# Patient Record
Sex: Male | Born: 1935 | Race: White | Hispanic: No | Smoking: Former smoker
Health system: Southern US, Community
[De-identification: ages and names within clinical notes are randomized; demographics above are authoritative.]

## PROBLEM LIST (undated history)

## (undated) DIAGNOSIS — C801 Malignant (primary) neoplasm, unspecified: Secondary | ICD-10-CM

## (undated) DIAGNOSIS — H269 Unspecified cataract: Secondary | ICD-10-CM

## (undated) DIAGNOSIS — T7840XA Allergy, unspecified, initial encounter: Secondary | ICD-10-CM

## (undated) DIAGNOSIS — M1990 Unspecified osteoarthritis, unspecified site: Secondary | ICD-10-CM

## (undated) DIAGNOSIS — I639 Cerebral infarction, unspecified: Secondary | ICD-10-CM

## (undated) DIAGNOSIS — K219 Gastro-esophageal reflux disease without esophagitis: Secondary | ICD-10-CM

## (undated) DIAGNOSIS — E785 Hyperlipidemia, unspecified: Secondary | ICD-10-CM

## (undated) DIAGNOSIS — I10 Essential (primary) hypertension: Secondary | ICD-10-CM

## (undated) HISTORY — DX: Unspecified osteoarthritis, unspecified site: M19.90

## (undated) HISTORY — PX: PROSTATECTOMY: SHX69

## (undated) HISTORY — DX: Allergy, unspecified, initial encounter: T78.40XA

## (undated) HISTORY — DX: Gastro-esophageal reflux disease without esophagitis: K21.9

## (undated) HISTORY — DX: Cerebral infarction, unspecified: I63.9

## (undated) HISTORY — PX: EYE SURGERY: SHX253

## (undated) HISTORY — DX: Malignant (primary) neoplasm, unspecified: C80.1

## (undated) HISTORY — DX: Unspecified cataract: H26.9

## (undated) HISTORY — DX: Hyperlipidemia, unspecified: E78.5

---

## 1975-02-14 HISTORY — PX: HERNIA REPAIR: SHX51

## 2003-05-06 ENCOUNTER — Ambulatory Visit (HOSPITAL_COMMUNITY): Admission: RE | Admit: 2003-05-06 | Discharge: 2003-05-06 | Payer: Self-pay | Admitting: Urology

## 2003-06-23 ENCOUNTER — Encounter (INDEPENDENT_AMBULATORY_CARE_PROVIDER_SITE_OTHER): Payer: Self-pay | Admitting: *Deleted

## 2003-06-23 ENCOUNTER — Ambulatory Visit (HOSPITAL_COMMUNITY): Admission: RE | Admit: 2003-06-23 | Discharge: 2003-06-23 | Payer: Self-pay | Admitting: Gastroenterology

## 2003-07-14 ENCOUNTER — Encounter (INDEPENDENT_AMBULATORY_CARE_PROVIDER_SITE_OTHER): Payer: Self-pay | Admitting: *Deleted

## 2003-07-14 ENCOUNTER — Inpatient Hospital Stay (HOSPITAL_COMMUNITY): Admission: RE | Admit: 2003-07-14 | Discharge: 2003-07-18 | Payer: Self-pay | Admitting: Urology

## 2005-04-10 ENCOUNTER — Ambulatory Visit (HOSPITAL_COMMUNITY): Admission: RE | Admit: 2005-04-10 | Discharge: 2005-04-10 | Payer: Self-pay | Admitting: Urology

## 2005-04-18 ENCOUNTER — Ambulatory Visit: Admission: RE | Admit: 2005-04-18 | Discharge: 2005-07-02 | Payer: Self-pay | Admitting: Radiation Oncology

## 2007-04-11 ENCOUNTER — Emergency Department (HOSPITAL_COMMUNITY): Admission: EM | Admit: 2007-04-11 | Discharge: 2007-04-11 | Payer: Self-pay | Admitting: Emergency Medicine

## 2007-05-01 ENCOUNTER — Ambulatory Visit (HOSPITAL_BASED_OUTPATIENT_CLINIC_OR_DEPARTMENT_OTHER): Admission: RE | Admit: 2007-05-01 | Discharge: 2007-05-01 | Payer: Self-pay | Admitting: Urology

## 2007-11-27 ENCOUNTER — Ambulatory Visit (HOSPITAL_COMMUNITY): Admission: RE | Admit: 2007-11-27 | Discharge: 2007-11-27 | Payer: Self-pay | Admitting: Urology

## 2010-06-28 NOTE — Op Note (Signed)
NAME:  Lawrence Douglas, Lawrence Douglas               ACCOUNT NO.:  [id number]   MEDICAL RECORD NO.:  [id number]          PATIENT TYPE:  AMB   LOCATION:  NESC                         FACILITY:  [facility]   PHYSICIAN:  Boston Service, M.D.DATE OF BIRTH:  [birth year]   DATE OF PROCEDURE:  05/01/2007  DATE OF DISCHARGE:                               OPERATIVE REPORT   PREOPERATIVE DIAGNOSIS:  A 77 year old male  who had RRP PLND in May  2004.  Final path showed a positive right seminal vesical which prompted  follow-up radiation therapy, Maryln Gottron, MD.  The patient was  recently discovered to have progressive narrowing at the bladder neck  consistent with contracture.  A Foley catheter was placed after office  dilation.  The patient returns today for laser incision of the bladder  neck.   POSTOPERATIVE DIAGNOSIS:  Same.   PROCEDURE:  Cystoscopy, retrograde holmium laser incision of bladder  neck contracture.   SURGEON:  Humphries.   ASSISTANT:  None.   ANESTHESIA:  General.   FINDINGS:  Bladder neck contracture.   SPECIMENS:  None.   ESTIMATED BLOOD LOSS:  Minimal.   COMPLICATIONS:  None obvious.   DESCRIPTION OF PROCEDURE:  The patient was prepped and draped in the  dorsal lithotomy position after institution of adequate level of general  anesthesia.  A well-lubricated 21-French panendoscope was gently  inserted, normal urethra.  The patient had wide-mouth bladder neck  contracture, prostate surgically absent, orifices well away from the  trigone effluxing clear urine.  Bladder consistent with recent  indwelling Foley catheter but no other obvious intravesical pathology.   Blocking catheter selected.  Right and left retrogrades were performed.  Prominent deviation of the ureter over the vessels but otherwise no  anatomic deformity.  No filling defect or obstruction.  Prompt drainage  at 5-10 minutes.  The ureteral catheter was then used to position the  500 laser fiber.  Bladder  neck contracture was then carefully incised  over period of about 35-40 minutes, taking care to keep the incision  strictly at the 3 o'clock and 9 o'clock  position down through scar tissue to pink vascular tissue.  Once the  contracture had been well incised, the bladder was filled to capacity.  Cystoscope was removed, and a 20-French Foley was inserted and left to  straight drain.  The patient was returned to recovery after B & O  suppository.           ______________________________  Boston Service, M.D.     RH/MEDQ  D:  05/01/2007  T:  05/01/2007  Job:  [id number]   cc:   Durenda Hurt, M.D.  Fax: [phone]   [facility], M.D.  Fax: [phone]

## 2010-06-28 NOTE — Consult Note (Signed)
NAME:  Lawrence Douglas, WEYENBERG NO.:  [id number]   MEDICAL RECORD NO.:  [id number]          PATIENT TYPE:  EMS   LOCATION:  ED                           FACILITY:  [facility]   PHYSICIAN:  Maretta Bees. Vonita Moss, M.D.DATE OF BIRTH:  August 27, 1931   DATE OF CONSULTATION:  04/11/2007  DATE OF DISCHARGE:                                 CONSULTATION   I was asked to see this patient for Dr. Read Drivers for urinary retention.  This gentleman had previous TUR of the prostate and recently had  hematuria.  He was worked up with a CT scan by Dr. Wanda Plump, and  yesterday around noontime, a cystoscopy in the office was performed.  The patient said that it was difficult, but he had no bleeding.  He was  sent home on some antibiotics.  From 6 o'clock last night until 2  o'clock this morning when he presented to the emergency room, he could  not void and was in pain.  Dr. Read Drivers' and his P.A. tried to insert a  Foley catheter but could not do, and they asked me to come and see this  gentleman and correct the problem.  Just before I arrived to the  emergency room, the patient voided light brown-colored urine and voided  about 250 mL with what he said was a normal flow.  He is much relieved  of his discomfort.   PHYSICAL EXAMINATION:  GENERAL:  He is alert and oriented and in no  acute distress.  ABDOMEN:  Soft, nontender.  I cannot palpate a full bladder.  GU:  Penis, urethra, meatus, scrotum, testicles, and epididymides  unremarkable.   IMPRESSION:  Postoperative urinary retention, probably resolved.  I  discussed the options with the patient including having me try to put in  a catheter at this time versus seeing if he can continue to void well on  his own and then come to the office as needed.  He opted not to have  further catheterization.  It is now just about 4:00 a.m., and I told the  patient not to drink any more fluids between now and 8 a.m. unless he  voids and then come to our office  tomorrow if he cannot void or is not  voiding with a good stream.  Otherwise, he is due to see Dr. Wanda Plump  on April 19, 2007, and he will finish up his antibiotics.      Maretta Bees. Vonita Moss, M.D.  Electronically Signed     LJP/MEDQ  D:  04/11/2007  T:  04/11/2007  Job:  [id number]

## 2010-07-01 NOTE — Discharge Summary (Signed)
NAME:  Lawrence Douglas, Lawrence Douglas                      ACCOUNT NO.:  [id number]   MEDICAL RECORD NO.:  [id number]                   PATIENT TYPE:  INP   LOCATION:  0347                                 FACILITY:  [facility]   PHYSICIAN:  Boston Service, M.D.             DATE OF BIRTH:  08/30/1931   DATE OF ADMISSION:  07/14/2003  DATE OF DISCHARGE:  07/18/2003                                 DISCHARGE SUMMARY   INTERNIST:  Leanne Chang, M.D.   UROLOGIST:  Boston Service, M.D.   INDICATIONS:  Medications, allergies, tobacco, ETOH.   Past medical history, social history, physical exam, and review of systems  are all outlined in the admitting note.   HOSPITAL COURSE:  The patient underwent radical retropubic prostatectomy and  bilateral pelvic lymph node dissection on Jul 14, 2003. The patient had a  pleasantly uneventful postoperative recovery with the exception of elevated  Jackson-Pratt output on postoperative day one and postoperative day two. By  postoperative day two hemoglobin was stable at 9.8, Jackson-Pratt output the  first day had been 445 cc and by the second day it had dropped to 160 cc.  The patient was advanced to a regular diet, was ambulatory without  assistance. Decision was made to keep the patient in the hospital until  Jackson-Pratt drain could be removed. By postoperative day three, Al Pimple output had decreased to about 5-10 cc per shift and it was removed.  The patient was discharged home on July 18, 2003, with Foley catheter to  straight drain.   DISCHARGE MEDICATIONS:  Vicodin, Cipro, and Detrol.   Pathology report on tissue removed at the time of surgery revealed all lymph  nodes negative for metastatic carcinoma. 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 The patient was informed of  this finding.   FOLLOW-UP PLANS:  Will remove half of the  staples in one week. The patient  is instructed to call us if he has any questions or problems.                                               Boston Service, M.D.    RH/MEDQ  D:  07/28/2003  T:  07/28/2003  Job:  [id number]   cc:   Leanne Chang, M.D.  [address]  [place]  [place] [address]  Fax: [phone]

## 2010-07-01 NOTE — H&P (Signed)
NAME:  Lawrence Douglas, Lawrence Douglas                      ACCOUNT NO.:  [id number]   MEDICAL RECORD NO.:  [id number]                   PATIENT TYPE:  INP   LOCATION:  0005                                 FACILITY:  [facility]   PHYSICIAN:  Boston Service, M.D.             DATE OF BIRTH:  March 29, 1931   DATE OF ADMISSION:  07/14/2003  DATE OF DISCHARGE:                                HISTORY & PHYSICAL   References made to my office notes from March 2005, April 2005, and May of  2005.  A 77 year old male with high grade prostate cancer has had thorough  follow-up with Dr. Blossom Hoops recently noted to have prostatic induration  with PSA rise from 3.8 to 5.4 F divided by T was 10%.  Biopsy May 09, 2003, showed prostatic adenocarcinoma Gleason 4 + 4 60% of the tissue on the  right and 40% of the tissue on the left.  Bone scan at [facility] May 06, 2003, osteoarthritis, moderate effusion right knee, degenerative changes  left knee, no evidence of metastatic disease.  Review with the patient, the  risks and benefits as well as the therapeutic alternatives  including  watchful waiting, hormonal therapy, radiation therapy and surgery.  Reviewed  with him the risks and benefits of surgery as well as the possibility of  positive lymph nodes and positive margins given his high volume Gleason 8  disease.  This patient's strong clinical preference is to proceed with  surgery and appropriate __________ have been made.   MEDICATIONS:  Cardura, Accupril, hydrochlorothiazide.  Aspirin was  discontinued about two weeks ago.   ALLERGIES:  Denies.   PAST SURGICAL HISTORY:  1. Left inguinal hernia in the distant past.  2. Hand surgery in November of 2004.   SOCIAL HISTORY:  The patient wisely quit smoking in 1955.  Denies alcohol.  Caffeine intake:  Small amounts of coffee and soft drinks.   FAMILY HISTORY:  Positive mainly for heart disease, four brothers and six  sisters.  Wife is in poor health after a  CVA.  She has ongoing issues of  hypertension and diabetes.  The patient has two sons and three daughters,  but has come by himself on all office visits.   PHYSICAL EXAMINATION:  GENERAL APPEARANCE:  A 77 year old male appearing  somewhat older than his stated age.  HEENT:  Negative adenopathy, negative bruit.  LUNGS:  Clear to percussion and auscultation.  CARDIOVASCULAR:  Regular rate and rhythm without murmur or gallop.  ABDOMEN:  Soft, scaphoid.  Positive bowel sounds.  Well-healed LIH incision.  GU:  Prostatic induration, right more than left.  Bilateral descended  atrophic testes.  Penis without lesions.  Easily  retractable foreskin.  NEUROLOGIC:  Grossly intact.   PLAN:  Will proceed later this a.m. with retropubic prostatectomy and pelvic  lymph node dissection.  Boston Service, M.D.    RH/MEDQ  D:  07/14/2003  T:  07/14/2003  Job:  [id number]   cc:   Albin Felling, M.D.

## 2010-07-01 NOTE — Op Note (Signed)
NAME:  Lawrence Douglas, Lawrence Douglas                      ACCOUNT NO.:  [id number]   MEDICAL RECORD NO.:  [id number]                   PATIENT TYPE:  AMB   LOCATION:  ENDO                                 FACILITY:  [facility]   PHYSICIAN:  Petra Kuba, M.D.                 DATE OF BIRTH:  1931/05/25   DATE OF PROCEDURE:  06/23/2003  DATE OF DISCHARGE:                                 OPERATIVE REPORT   PROCEDURE:  Colonoscopy with biopsy.   INDICATIONS:  Guaiac positivity.  The patient overdue for colonic screening.  Consent was signed after risks, benefits, methods, and options were  thoroughly discussed in the office.   PREMEDICATIONS:  Demerol 50 mg, Versed 5 mg.   DESCRIPTION OF PROCEDURE:  Rectal inspection pertinent for external  hemorrhoids, small.  Digital exam was negative.  Video pediatric adjustable  colonoscope was inserted and easily advanced around the colon to the cecum.  This did require some abdominal pressure but no position changes.  On  insertion a few tiny transverse and right-sided polyps were seen.  Also some  scattered early diverticula were seen but no signs of bleeding.  Unfortunately, the prep on insertion was only fair.  The cecum was  identified by the appendiceal orifice and the ileocecal valve.  The scope  was inserted a short ways into the terminal ileum which was normal until  blood was seen coming from above.  The scope was slowly withdrawn.  Prep was  fair.  After over a liter of washing and suctioning, we were able to wash  and suction most of the stool or at least move it down and away from the  folds we were looking at.  On slow withdrawal through the colon, there were  probably eight ascending and transverse tiny, small polyps which were all  cold biopsied times one or two in the first container.  Scattered rare,  early diverticula were confirmed.  In the more proximal descending, a small  polyp was seen and a few cold biopsies were obtained and this was put  in the  second container.  We were at 45 cm on withdrawal.  The scope was then  further withdrawn.  No additional polyps were seen but the rare diverticula.  We slowly withdrew back to the rectum.  Anal rectal pull-through and  retroflexion confirms some small hemorrhoids.  The scope was reinserted a  short ways up the left side of the colon.  Air and water were suctioned.  The scope was removed.  The patient tolerated the procedure well.  There was  no obvious immediate complications.   ENDOSCOPIC DIAGNOSES:  1. Internal and external hemorrhoids, small.  2. Occasional small random diverticula.  3. Multiple right-sided tiny to small polyps, cold biopsied.  Elected not to     use cautery based on the prep.  4. Small descending polyp at 45 cm, cold biopsied.  5. Otherwise within  normal limits to the terminal ileum without any bleeding     seen.   PLAN:  Await CBC and will need to recheck guaiac in the future to determine  further work-up and plans.  Await pathology to determine future colonic  screening.                                               Petra Kuba, M.D.    MEM/MEDQ  D:  06/23/2003  T:  06/23/2003  Job:  [id number]   cc:   Leanne Chang, M.D.  [address]  [place]  [place] [address]  Fax: [phone]   Boston Service, M.D.  [address]  [place]  [place] [address]  Fax: [phone]

## 2010-07-01 NOTE — Op Note (Signed)
NAME:  Lawrence Douglas, Lawrence Douglas                      ACCOUNT NO.:  [id number]   MEDICAL RECORD NO.:  [id number]                   PATIENT TYPE:  INP   LOCATION:  0005                                 [facility]ACILITY:  [facility]   PHYSICIAN:  Boston Service, M.D.             DATE OF BIRTH:  11-08-1931   DATE OF PROCEDURE:  07/14/2003  DATE OF DISCHARGE:                                 OPERATIVE REPORT   LMD:  Neomia Dear, M.D.   UROLOGIST:  Boston Service, M.D.   FIRST ASSISTANT:  Bailey Mech, M.D.  Ronald L. Earlene Plater, M.D.   PREOPERATIVE DIAGNOSIS:  High-grade bilateral prostate cancer.   POSTOPERATIVE DIAGNOSIS:  High-grade bilateral prostate cancer.   PROCEDURES:  1. Radical retropubic prostatectomy.  2. Bilateral pelvic lymph node dissection.   SPECIMENS:  Right pelvic lymph nodes and left pelvic lymph nodes.  Vas and  attached seminal vesicles.   DRAINS:  20-French Foley catheter.  10-French Jackson-Pratt.   ANESTHESIA:  General.   ESTIMATED BLOOD LOSS:  700 cc.   DESCRIPTION OF PROCEDURE:  The patient was prepped and draped in the supine  position, after institution of an adequate level of general anesthesia.  Midline infraumbilical incision was carried through the skin and muscular  layers of the anterior abdominal wall.  Gentle retraction on the peritoneal  cavity revealed the retropubic space, as well as the right and left  obturator fossa.  Bookwalter retractor was positioned, node dissection was  then carried out between the external iliac veins superiorly and the  obturator nerve inferiorly.  Surprisingly, given the patient's high Glisson  grade, no enlarged or indurated nodes were identified.  All nodes were sent  for permanent section.  Endopelvic fascia was then taken down sharply lateral to the prostate, and  extended along the line of the neurovascular bundles.  Puboprostatic  ligaments were quite broad on each side, and in the course of taking down  the right  puboprostatic ligaments, bleeding was encountered along the right  lateral edge of the prostate.  Figure-of-eight sutures of 2-0 chromic  controlled bleeding.  McDougal clamp was then passed around the urethra  anteriorly.  An 0 Vicryl free tie was then used to ligate the dorsal vein  complex.  Back bleeding stitch of 0 chromic was placed along the anterior  prostate, with right-angled clamp distal to the prostatic apex anterior to  the urethra.  Dorsal vein complex was then divided. The urethra was easily  identified and circled with the fine Metzenbaum  scissors.  McDougal forceps  were then passed posterior to the urethra.  The urethra was elevated;  umbilical tape was passed posterior to the urethra, and a stump of urethra  was then divided using the knife on the long handle.  Indwelling catheter  was then retracted superiorly.  The patient had, what appeared to be,  adhesions within the right and left prostatic pedicle; this required careful  dissection on both  right and left sides, with fine right-angled forceps and  right-angled clip appliers.  The area of induration extended along the  prostatic base to the junction of the seminal vesicles and prostate --  suspicious for local tumor extension in this area, given pre-op biopsy of  Glisson 8 (60% on one side and 40% on the other side).  Careful attempt was  made to dissect out the vas and seminal vesicles.  During this dissection, a  small hole was made in the bladder, closed with two stitches of 2-0 chromic.  Once vas and seminal vesicles were freed up on both the right and left  sides, Vanderbilt forceps were used to spread apart muscular fibers at the  bladder neck.  The prostatic base was carefully separated from the bladder  neck.  Indwelling Foley catheter was then retracted superiorly.  Anterior  surface of the vas and seminal vesicles were then dissected free.  The  prostate with attached vas and seminal vesicles was then sent  as a specimen.   Careful inspection of the bladder showed a single chromic stitch along the  posterior trigone.  Right and left ureteral orifices were identified, after  infusion of indigo carmine.  Then 6-French ureteral catheter passed easily  at both the right and left orifice, without any resistance at about 15-20 cm  -- with immediate efflux of blue urine through the ureteral catheters.  Then  Grunwald sound was then passed per urethra.  Stitches were placed in the  urethra at the 12 o'clock, 10 o'clock, 8 o'clock, 4 o'clock and 2 o'clock  position.  Then 20-French silicone catheter, 20 cc in a 5 cc balloon was  then brought through the urethra and into the bladder.  Urethral stitches  were then brought through the bladder neck in a similar position; 2-0 Vicryl  on a UR6 needle with gentle traction on the catheter.  Urethral anastomosis  was then tied down.  A 10 flat Jackson-Pratt drain was brought out through a  stab incision in the left lower quadrant.   Fascia of the anterior abdominal wall was closed with #1 PDS in a running  stitch.  Skin was closed with skin staples.  Foley catheter was left to  straight drain.  Jackson-Pratt was left to bulb suction.  The patient was  then returned to recovery room in satisfactory condition.   ESTIMATED BLOOD LOSS:  700 cc.                                               Boston Service, M.D.    RH/MEDQ  D:  07/14/2003  T:  07/14/2003  Job:  [id number]   cc:   Windy Fast L. Ovidio Hanger, M.D.  [address]  [place]  [place] [address]  Fax: [phone]

## 2010-11-04 LAB — URINE MICROSCOPIC-ADD ON

## 2010-11-04 LAB — URINALYSIS, ROUTINE W REFLEX MICROSCOPIC
Bilirubin Urine: NEGATIVE
Leukocytes, UA: NEGATIVE
Specific Gravity, Urine: 1.019

## 2010-11-07 ENCOUNTER — Other Ambulatory Visit (HOSPITAL_COMMUNITY): Payer: Self-pay | Admitting: Urology

## 2010-11-07 DIAGNOSIS — C61 Malignant neoplasm of prostate: Secondary | ICD-10-CM

## 2010-11-07 LAB — POCT I-STAT 4, (NA,K, GLUC, HGB,HCT)
Glucose, Bld: 117 — ABNORMAL HIGH
HCT: 52
Hemoglobin: 17.7 — ABNORMAL HIGH
Operator id: 268271
Potassium: 4.1
Sodium: 139

## 2010-11-16 ENCOUNTER — Encounter (HOSPITAL_COMMUNITY)
Admission: RE | Admit: 2010-11-16 | Discharge: 2010-11-16 | Disposition: A | Discharge status: Home / Self Care | Payer: Medicare Other | Source: Ambulatory Visit | Attending: Urology | Admitting: Urology

## 2010-11-16 DIAGNOSIS — M1710 Unilateral primary osteoarthritis, unspecified knee: Secondary | ICD-10-CM | POA: Insufficient documentation

## 2010-11-16 DIAGNOSIS — R972 Elevated prostate specific antigen [PSA]: Secondary | ICD-10-CM | POA: Insufficient documentation

## 2010-11-16 DIAGNOSIS — C61 Malignant neoplasm of prostate: Secondary | ICD-10-CM | POA: Insufficient documentation

## 2010-11-16 DIAGNOSIS — M25569 Pain in unspecified knee: Secondary | ICD-10-CM | POA: Insufficient documentation

## 2010-11-16 MED ORDER — TECHNETIUM TC 99M MEDRONATE IV KIT
24.0000 | PACK | Freq: Once | INTRAVENOUS | Status: AC | PRN
  Administered 2010-11-16: 24 via INTRAVENOUS

## 2014-11-13 ENCOUNTER — Encounter (HOSPITAL_COMMUNITY): Payer: Self-pay | Admitting: Emergency Medicine

## 2014-11-13 ENCOUNTER — Emergency Department (HOSPITAL_COMMUNITY): Payer: Medicare Other

## 2014-11-13 ENCOUNTER — Emergency Department (HOSPITAL_COMMUNITY)
Admission: EM | Admit: 2014-11-13 | Discharge: 2014-11-13 | Disposition: A | Discharge status: Home / Self Care | Payer: Medicare Other | Attending: Emergency Medicine | Admitting: Emergency Medicine

## 2014-11-13 DIAGNOSIS — Y998 Other external cause status: Secondary | ICD-10-CM | POA: Insufficient documentation

## 2014-11-13 DIAGNOSIS — Y9389 Activity, other specified: Secondary | ICD-10-CM | POA: Diagnosis not present

## 2014-11-13 DIAGNOSIS — M79672 Pain in left foot: Secondary | ICD-10-CM

## 2014-11-13 DIAGNOSIS — I10 Essential (primary) hypertension: Secondary | ICD-10-CM | POA: Insufficient documentation

## 2014-11-13 DIAGNOSIS — S99922A Unspecified injury of left foot, initial encounter: Secondary | ICD-10-CM | POA: Insufficient documentation

## 2014-11-13 DIAGNOSIS — W228XXA Striking against or struck by other objects, initial encounter: Secondary | ICD-10-CM | POA: Insufficient documentation

## 2014-11-13 DIAGNOSIS — Y9289 Other specified places as the place of occurrence of the external cause: Secondary | ICD-10-CM | POA: Insufficient documentation

## 2014-11-13 DIAGNOSIS — S99912A Unspecified injury of left ankle, initial encounter: Secondary | ICD-10-CM | POA: Diagnosis not present

## 2014-11-13 DIAGNOSIS — M25572 Pain in left ankle and joints of left foot: Secondary | ICD-10-CM

## 2014-11-13 HISTORY — DX: Essential (primary) hypertension: I10

## 2014-11-13 NOTE — ED Notes (Signed)
Pt returned from xray

## 2014-11-13 NOTE — ED Provider Notes (Signed)
CSN: [id number]     Arrival date & time 11/13/14  6295 History   First MD Initiated Contact with Patient 11/13/14 [phone]     Chief Complaint  Patient presents with  . Ankle Pain     (Consider location/radiation/quality/duration/timing/severity/associated sxs/prior Treatment) Patient is a 81 y.o. male presenting with ankle pain. The history is provided by the patient and the spouse.  Ankle Pain Associated symptoms: no back pain and no fever    patient with complaint of left ankle pain after hitting it on the door jam 2 days ago. Swelling noted patient concerned about a fracture. Patient has been able to walk on it but has discomfort in the lateral part of the ankle and on the heel. No other complaints.  Past Medical History  Diagnosis Date  . Hypertension    Past Surgical History  Procedure Laterality Date  . Hernia repair  1977   No family history on file. Social History  Substance Use Topics  . Smoking status: Never Smoker   . Smokeless tobacco: None  . Alcohol Use: No    Review of Systems  Constitutional: Negative for fever.  HENT: Negative for congestion.   Eyes: Negative for redness.  Respiratory: Negative for shortness of breath.   Cardiovascular: Negative for chest pain.  Gastrointestinal: Negative for abdominal pain.  Genitourinary: Negative for dysuria.  Musculoskeletal: Positive for joint swelling. Negative for back pain.  Skin: Negative for rash and wound.  Neurological: Negative for headaches.  Hematological: Does not bruise/bleed easily.  Psychiatric/Behavioral: Negative for confusion.      Allergies  Review of patient's allergies indicates no known allergies.  Home Medications   Prior to Admission medications   Not on File   BP 96/79 mmHg  Pulse 84  Temp(Src) 98.6 F (37 C) (Oral)  Resp 18  Ht 5\' 6"  (1.676 m)  Wt 190 lb (86.183 kg)  BMI 30.68 kg/m2  SpO2 97% Physical Exam  Constitutional: He is oriented to person, place, and time. He  appears well-developed and well-nourished. No distress.  HENT:  Head: Normocephalic and atraumatic.  Mouth/Throat: Oropharynx is clear and moist.  Eyes: Conjunctivae and EOM are normal. Pupils are equal, round, and reactive to light.  Cardiovascular: Normal rate, regular rhythm and normal heart sounds.   Pulmonary/Chest: Effort normal and breath sounds normal. No respiratory distress.  Abdominal: Soft. Bowel sounds are normal. There is no tenderness.  Musculoskeletal: He exhibits edema and tenderness.  Patient with lateral swelling to the left ankle with some tenderness to palpation as well as some tenderness over the heel. Patient has a bit of deformity to both ankles. Dorsalis pedis pulses 1+ Refill is 2 seconds. Patient with good range of motion at the ankle and toes no proximal fibula tenderness on the left side.  Neurological: He is alert and oriented to person, place, and time. No cranial nerve deficit. He exhibits normal muscle tone. Coordination normal.  Skin: Skin is warm. No rash noted. No erythema.  Nursing note and vitals reviewed.   ED Course  Procedures (including critical care time) Labs Review Labs Reviewed - No data to display  Imaging Review Dg Ankle Complete Left  11/13/2014   CLINICAL DATA:  Slipped and twisted LEFT ankle today and bathroom, lateral LEFT ankle pain and swelling radiating to dorsal aspect of foot, limited range of motion  EXAM: LEFT ANKLE COMPLETE - 3+ VIEW  COMPARISON:  None  FINDINGS: Diffuse osseous demineralization.  Soft tissue swelling greatest laterally.  Joint spaces  preserved.  No acute fracture, dislocation, or bone destruction.  Plantar calcaneal spur.  IMPRESSION: Osseous demineralization with calcaneal spurring.  No acute osseous abnormalities.   Electronically Signed   By: Lavonia Dana M.D.   On: 11/13/2014 10:28   Dg Foot Complete Left  11/13/2014   CLINICAL DATA:  Twisted foot in bathroom. Foot pain and swelling. Initial encounter.  EXAM:  LEFT FOOT - COMPLETE 3+ VIEW  COMPARISON:  05/06/2003  FINDINGS: There is no evidence of fracture or dislocation. Diffuse osteopenia noted. Osteoarthritis seen involving the first tarsal- metatarsal joint. Plantar calcaneal bone spur also noted.  IMPRESSION: No acute findings.   Electronically Signed   By: Earle Gell M.D.   On: 11/13/2014 10:39   I have personally reviewed and evaluated these images and lab results as part of my medical decision-making.   EKG Interpretation None      MDM   Final diagnoses:  Ankle pain, left  Foot pain, left   X-rays of the left foot and left ankle without any bony injury. No tenderness to the proximal fibula. Cap refill to the toes is 2 seconds. Palpable dorsalis pedis pulse 1+.  Patient will be referred to podiatry for additional follow-up for the foot pain and ankle pain. Patient has pain medicine at home.    Fredia Sorrow, MD 11/13/14 1125

## 2014-11-13 NOTE — Discharge Instructions (Signed)
X-rays of the left foot and ankle without any bony injury. However you do have a lot of arthritis in the area and a heel spur. Recommend follow-up with podiatry.

## 2014-11-13 NOTE — ED Notes (Signed)
C/o left ankle pain after patient hit a door jam with it 2 days ago.  Swelling noted but no obvious deformity.  Able to bear weight but with pain.  CMS intact.  VSS

## 2014-11-19 ENCOUNTER — Ambulatory Visit (INDEPENDENT_AMBULATORY_CARE_PROVIDER_SITE_OTHER): Payer: Medicare Other | Admitting: Sports Medicine

## 2014-11-19 ENCOUNTER — Encounter: Payer: Self-pay | Admitting: Sports Medicine

## 2014-11-19 DIAGNOSIS — M25572 Pain in left ankle and joints of left foot: Secondary | ICD-10-CM

## 2014-11-19 DIAGNOSIS — S93402A Sprain of unspecified ligament of left ankle, initial encounter: Secondary | ICD-10-CM | POA: Diagnosis not present

## 2014-11-19 DIAGNOSIS — M25472 Effusion, left ankle: Secondary | ICD-10-CM | POA: Diagnosis not present

## 2014-11-19 DIAGNOSIS — M79609 Pain in unspecified limb: Secondary | ICD-10-CM

## 2014-11-19 DIAGNOSIS — B351 Tinea unguium: Secondary | ICD-10-CM | POA: Diagnosis not present

## 2014-11-19 NOTE — Progress Notes (Signed)
Subjective:    Patient ID: Lawrence Douglas, male    DOB: 03-15-1931, 81 y.o.   MRN: [id number]  HPI: [place], 81 year old male patient presents to office today stating that he went to ER after tripping, falling, and twisting left ankle. Patient is assisted by daughter. Patient states that he had an x-ray done and the ER doc saw a bone spur so wanted to have it looked at to see if anything can be done about it. Patient states that initially he thought he broke his left ankle because it hurts directly over the fibula. Patient states that this happened about 1 week ago since the pain in the ankle has gotten better.  Patient also desires nails to be trimmed, unable to trim himself. Patient denies any related constitutional symptoms or any other pedal complaints at this time.   There are no active problems to display for this patient.  No current outpatient prescriptions on file prior to visit.   No current facility-administered medications on file prior to visit.   No Known Allergies  Review of Systems  All other systems reviewed and are negative.      Objective:   Physical Exam Objective:  General: Well developed, nourished, in no acute distress, alert and oriented x3  Dermatological: Skin is warm, dry and supple bilateral. Nails x 10 are tender, thick, discolored with subungal debris resembling onychomycosis. There are no open sores, no preulcerative lesions, no rash or signs of infection present.  Vascular: Dorsalis Pedis artery and Posterior Tibial artery pedal pulses are 2/4 bilateral with immedate capillary fill time. Diminished Pedal hair growth present. Mild varicosities bilateral. Focal pitting 1+edema to left ankle.   Neruologic: Grossly intact via light touch bilateral. Vibratory intact via tuning fork bilateral. Protective threshold with Semmes Wienstein monofilament intact to all pedal sites bilateral. No Babinski or clonus noted bilateral.   Musculoskeletal:  Mild tenderness with palpation to left fibula; No pain to site with tuning fork. No ankle instability, no pain with palpation to medial and lateral left ankle ligaments,  No pain, crepitus, or limitation noted with foot and ankle range of motion bilateral. Muscular strength 5/5 in all groups tested bilateral.  Gait: Cane assisted, Nonantalgic.   X-rays, Left foot and ankle (11/13/14) from [facility] reviewed: Significant osteopenia, pes planus, enthesopathy, mild midfoot arthritis. No fracture/dislocation. Ankle mortise preserved.      Assessment & Plan:   Problem List Items Addressed This Visit    None    Visit Diagnoses    Ankle pain, left    -  Primary    Sprain of ankle, left, initial encounter        Left ankle swelling        Pain due to onychomycosis of nail          -Complete examination performed -X[facility] from [facility] reviewed -Discussed with patient plantar heel spur and how can be incidental finding; does not correlate to current ankle pain and when asymptomatic no treatment is done for it -Rx elastic anklet for left ankle swelling and to give ankle support; educated on use and advised patient that likely contusion present over the fibula from trip/fall injury and that this well take time to resolve. Recommend local ice 1-2x daily as needed to area. Recommend supportive shoes and to walk at all times with cane to prevent falls. -Mechanically debrided mycotic nails x 10 using sterile nail nipper and reduced thickness using dremel without incident. -Patient to return  to office as needed or sooner if problems arise.  Landis Martins, DPM

## 2014-11-20 ENCOUNTER — Encounter: Payer: Self-pay | Admitting: Sports Medicine

## 2016-06-26 DIAGNOSIS — E785 Hyperlipidemia, unspecified: Secondary | ICD-10-CM | POA: Diagnosis not present

## 2016-06-26 DIAGNOSIS — I10 Essential (primary) hypertension: Secondary | ICD-10-CM | POA: Diagnosis not present

## 2016-06-30 DIAGNOSIS — C61 Malignant neoplasm of prostate: Secondary | ICD-10-CM | POA: Diagnosis not present

## 2016-06-30 DIAGNOSIS — M8580 Other specified disorders of bone density and structure, unspecified site: Secondary | ICD-10-CM | POA: Diagnosis not present

## 2016-08-31 DIAGNOSIS — C61 Malignant neoplasm of prostate: Secondary | ICD-10-CM | POA: Diagnosis not present

## 2016-08-31 DIAGNOSIS — N320 Bladder-neck obstruction: Secondary | ICD-10-CM | POA: Diagnosis not present

## 2016-09-25 DIAGNOSIS — E785 Hyperlipidemia, unspecified: Secondary | ICD-10-CM | POA: Diagnosis not present

## 2016-09-25 DIAGNOSIS — I10 Essential (primary) hypertension: Secondary | ICD-10-CM | POA: Diagnosis not present

## 2016-09-25 DIAGNOSIS — M810 Age-related osteoporosis without current pathological fracture: Secondary | ICD-10-CM | POA: Diagnosis not present

## 2016-09-25 DIAGNOSIS — C61 Malignant neoplasm of prostate: Secondary | ICD-10-CM | POA: Diagnosis not present

## 2016-09-27 ENCOUNTER — Emergency Department (HOSPITAL_COMMUNITY): Payer: Medicare Other

## 2016-09-27 ENCOUNTER — Encounter (HOSPITAL_COMMUNITY): Payer: Self-pay

## 2016-09-27 ENCOUNTER — Inpatient Hospital Stay (HOSPITAL_COMMUNITY)
Admission: EM | Admit: 2016-09-27 | Discharge: 2016-09-30 | DRG: 872 | Disposition: A | Discharge status: Skilled Nursing Facility | Payer: Medicare Other | Attending: Internal Medicine | Admitting: Internal Medicine

## 2016-09-27 DIAGNOSIS — M1711 Unilateral primary osteoarthritis, right knee: Secondary | ICD-10-CM | POA: Diagnosis not present

## 2016-09-27 DIAGNOSIS — A419 Sepsis, unspecified organism: Secondary | ICD-10-CM | POA: Diagnosis not present

## 2016-09-27 DIAGNOSIS — K5900 Constipation, unspecified: Secondary | ICD-10-CM | POA: Diagnosis present

## 2016-09-27 DIAGNOSIS — M79651 Pain in right thigh: Secondary | ICD-10-CM | POA: Diagnosis not present

## 2016-09-27 DIAGNOSIS — E785 Hyperlipidemia, unspecified: Secondary | ICD-10-CM | POA: Diagnosis not present

## 2016-09-27 DIAGNOSIS — E876 Hypokalemia: Secondary | ICD-10-CM | POA: Diagnosis not present

## 2016-09-27 DIAGNOSIS — R319 Hematuria, unspecified: Secondary | ICD-10-CM | POA: Diagnosis not present

## 2016-09-27 DIAGNOSIS — Z7982 Long term (current) use of aspirin: Secondary | ICD-10-CM | POA: Diagnosis not present

## 2016-09-27 DIAGNOSIS — C61 Malignant neoplasm of prostate: Secondary | ICD-10-CM | POA: Diagnosis present

## 2016-09-27 DIAGNOSIS — S199XXA Unspecified injury of neck, initial encounter: Secondary | ICD-10-CM | POA: Diagnosis not present

## 2016-09-27 DIAGNOSIS — R7989 Other specified abnormal findings of blood chemistry: Secondary | ICD-10-CM | POA: Diagnosis not present

## 2016-09-27 DIAGNOSIS — J0130 Acute sphenoidal sinusitis, unspecified: Secondary | ICD-10-CM | POA: Diagnosis not present

## 2016-09-27 DIAGNOSIS — W19XXXA Unspecified fall, initial encounter: Secondary | ICD-10-CM

## 2016-09-27 DIAGNOSIS — Z6829 Body mass index (BMI) 29.0-29.9, adult: Secondary | ICD-10-CM

## 2016-09-27 DIAGNOSIS — R296 Repeated falls: Secondary | ICD-10-CM | POA: Diagnosis present

## 2016-09-27 DIAGNOSIS — S0990XA Unspecified injury of head, initial encounter: Secondary | ICD-10-CM | POA: Diagnosis not present

## 2016-09-27 DIAGNOSIS — E440 Moderate protein-calorie malnutrition: Secondary | ICD-10-CM | POA: Diagnosis present

## 2016-09-27 DIAGNOSIS — J329 Chronic sinusitis, unspecified: Secondary | ICD-10-CM | POA: Diagnosis present

## 2016-09-27 DIAGNOSIS — R451 Restlessness and agitation: Secondary | ICD-10-CM | POA: Diagnosis not present

## 2016-09-27 DIAGNOSIS — R6510 Systemic inflammatory response syndrome (SIRS) of non-infectious origin without acute organ dysfunction: Secondary | ICD-10-CM | POA: Diagnosis present

## 2016-09-27 DIAGNOSIS — R140 Abdominal distension (gaseous): Secondary | ICD-10-CM | POA: Diagnosis not present

## 2016-09-27 DIAGNOSIS — M79622 Pain in left upper arm: Secondary | ICD-10-CM | POA: Diagnosis not present

## 2016-09-27 DIAGNOSIS — S4992XA Unspecified injury of left shoulder and upper arm, initial encounter: Secondary | ICD-10-CM | POA: Diagnosis not present

## 2016-09-27 DIAGNOSIS — M25551 Pain in right hip: Secondary | ICD-10-CM | POA: Diagnosis not present

## 2016-09-27 DIAGNOSIS — Z79899 Other long term (current) drug therapy: Secondary | ICD-10-CM

## 2016-09-27 DIAGNOSIS — E860 Dehydration: Secondary | ICD-10-CM | POA: Diagnosis not present

## 2016-09-27 DIAGNOSIS — M25561 Pain in right knee: Secondary | ICD-10-CM | POA: Diagnosis not present

## 2016-09-27 DIAGNOSIS — I10 Essential (primary) hypertension: Secondary | ICD-10-CM | POA: Diagnosis present

## 2016-09-27 DIAGNOSIS — S79911A Unspecified injury of right hip, initial encounter: Secondary | ICD-10-CM | POA: Diagnosis not present

## 2016-09-27 DIAGNOSIS — S299XXA Unspecified injury of thorax, initial encounter: Secondary | ICD-10-CM | POA: Diagnosis not present

## 2016-09-27 LAB — CBC WITH DIFFERENTIAL/PLATELET
BASOS ABS: 0 10*3/uL (ref 0.0–0.1)
BASOS PCT: 0 %
Eosinophils Absolute: 0 10*3/uL (ref 0.0–0.7)
Eosinophils Relative: 0 %
HEMATOCRIT: 36.6 % — AB (ref 39.0–52.0)
HEMOGLOBIN: 12.2 g/dL — AB (ref 13.0–17.0)
LYMPHS PCT: 12 %
Lymphs Abs: 1.6 10*3/uL (ref 0.7–4.0)
MCH: 29.3 pg (ref 26.0–34.0)
MCHC: 33.3 g/dL (ref 30.0–36.0)
MCV: 88 fL (ref 78.0–100.0)
Monocytes Absolute: 1.5 10*3/uL — ABNORMAL HIGH (ref 0.1–1.0)
Monocytes Relative: 11 %
NEUTROS ABS: 10.4 10*3/uL — AB (ref 1.7–7.7)
NEUTROS PCT: 77 %
Platelets: 285 10*3/uL (ref 150–400)
RBC: 4.16 MIL/uL — AB (ref 4.22–5.81)
RDW: 14.7 % (ref 11.5–15.5)
WBC: 13.5 10*3/uL — AB (ref 4.0–10.5)

## 2016-09-27 LAB — I-STAT CG4 LACTIC ACID, ED: Lactic Acid, Venous: 2.39 mmol/L (ref 0.5–1.9)

## 2016-09-27 LAB — COMPREHENSIVE METABOLIC PANEL
ALBUMIN: 3.7 g/dL (ref 3.5–5.0)
ALT: 10 U/L — AB (ref 17–63)
ANION GAP: 8 (ref 5–15)
AST: 22 U/L (ref 15–41)
Alkaline Phosphatase: 52 U/L (ref 38–126)
BUN: 20 mg/dL (ref 6–20)
CHLORIDE: 110 mmol/L (ref 101–111)
CO2: 23 mmol/L (ref 22–32)
CREATININE: 0.9 mg/dL (ref 0.61–1.24)
Calcium: 9.3 mg/dL (ref 8.9–10.3)
GFR calc non Af Amer: >60 mL/min (ref >60)
GLUCOSE: 163 mg/dL — AB (ref 65–99)
Potassium: 4 mmol/L (ref 3.5–5.1)
SODIUM: 141 mmol/L (ref 135–145)
Total Bilirubin: 0.5 mg/dL (ref 0.3–1.2)
Total Protein: 6.8 g/dL (ref 6.5–8.1)

## 2016-09-27 LAB — URINALYSIS, ROUTINE W REFLEX MICROSCOPIC
BILIRUBIN URINE: NEGATIVE
Bacteria, UA: NONE SEEN
GLUCOSE, UA: NEGATIVE mg/dL
KETONES UR: 5 mg/dL — AB
LEUKOCYTES UA: NEGATIVE
Nitrite: NEGATIVE
PH: 5 (ref 5.0–8.0)
Protein, ur: 30 mg/dL — AB
Specific Gravity, Urine: 1.024 (ref 1.005–1.030)

## 2016-09-27 MED ORDER — SODIUM CHLORIDE 0.9 % IV SOLN
3.0000 g | Freq: Four times a day (QID) | INTRAVENOUS | Status: DC
  Filled 2016-09-27: qty 3

## 2016-09-27 MED ORDER — SODIUM CHLORIDE 0.9 % IV BOLUS (SEPSIS)
1000.0000 mL | Freq: Once | INTRAVENOUS | Status: AC
  Administered 2016-09-27: 1000 mL via INTRAVENOUS

## 2016-09-27 NOTE — ED Triage Notes (Signed)
Lawrence Douglas yesterday states he tripped and fell now left upper arm pain worse with movement and right knee pain able to bear weight with his walker since fall yesterday. No LOC voiced no other pain voiced.

## 2016-09-27 NOTE — ED Provider Notes (Addendum)
[facility] DEPT Provider Note   CSN: [id number] Arrival date & time: 09/27/16  1954     History   Chief Complaint Chief Complaint  Patient presents with  . Fall    HPI Lawrence Douglas is a 83 y.o. male.  HPI    83 yo M with h/o HTN, severw knee OA here with fatigue and pain. Pt reports that over the past 3 weeks, he has fallen multiple times due to his legs "giving out." Last night, the pt reportedly was holding a wall fixture when it "gave out," causing him to fall. He does admit he has been more tired/fatigued lately and that this is contributing. His only sx includee nasal congestion, sinus pressure, and drainage in his mouth that has decreased his appetite. Otherwise, he states he had no significant pain prior to the fall. Since the fall. He has had severe right knee pain, right hip pain. He has been unable to ambulate due to this pain and has been lyign in bed. He feels fatigued and mildly weak. Otherwise, denies complaints. Of note, his knee does not hurt when sitting still. He has had no redness/wounds to the knee.  Past Medical History:  Diagnosis Date  . Hypertension     Patient Active Problem List   Diagnosis Date Noted  . Dehydration 09/28/2016  . Essential hypertension 09/28/2016  . Elevated lactic acid level 09/28/2016  . SIRS (systemic inflammatory response syndrome) ([place]) 09/28/2016  . Sinusitis 09/28/2016  . Prostate cancer ([place]) 09/28/2016  . Hematuria 09/28/2016    Past Surgical History:  Procedure Laterality Date  . HERNIA REPAIR  1977       Home Medications    Prior to Admission medications   Medication Sig Start Date End Date Taking? Authorizing Provider  amLODipine (NORVASC) 10 MG tablet Take 10 mg by mouth daily. 10/31/14  Yes [provider]  bicalutamide (CASODEX) 50 MG tablet TAKE 1 TAB DAILY FOR PROSTATE CANCER 10/14/14  Yes [provider]  CALCIUM CARBONATE PO Take 2 tablets by mouth daily.    Yes [provider]  enalapril (VASOTEC) 20 MG tablet Take 20 mg by mouth daily.  10/31/14  Yes [provider]  Omega-3 Fatty Acids (FISH OIL) 600 MG CAPS Take 2 capsules by mouth daily.   Yes [provider]  simvastatin (ZOCOR) 40 MG tablet Take 40 mg by mouth at bedtime. 10/31/14  Yes [provider]    Family History Family History  Problem Relation Age of Onset  . Stroke Father   . Leukemia Brother   . Stroke Brother   . Diabetes Daughter     Social History Social History  Substance Use Topics  . Smoking status: Former Research scientist (life sciences)  . Smokeless tobacco: Never Used  . Alcohol use No     Allergies   Patient has no known allergies.   Review of Systems Review of Systems  Constitutional: Positive for fatigue. Negative for chills and fever.  HENT: Positive for sinus pressure and sore throat. Negative for congestion and rhinorrhea.   Eyes: Negative for visual disturbance.  Respiratory: Negative for cough, shortness of breath and wheezing.   Cardiovascular: Negative for chest pain and leg swelling.  Gastrointestinal: Negative for abdominal pain, diarrhea, nausea and vomiting.  Genitourinary: Negative for dysuria and flank pain.  Musculoskeletal: Positive for arthralgias and gait problem. Negative for neck pain and neck stiffness.  Skin: Negative for rash and wound.  Allergic/Immunologic: Negative for immunocompromised state.  Neurological: Positive  for weakness. Negative for syncope and headaches.  All other systems reviewed and are negative.    Physical Exam Updated Vital Signs BP (!) 151/71 (BP Location: Right Arm)   Pulse 87   Temp 97.9 F (36.6 C) (Oral)   Resp 18   Ht 5\' 7"  (1.702 m)   Wt 86.1 kg (189 lb 13.1 oz)   SpO2 93%   BMI 29.73 kg/m   Physical Exam  Constitutional: He is oriented to person, place, and time. He appears well-developed and well-nourished. No distress.  HENT:  Head: Normocephalic and atraumatic.  Moderate nasal  mucosal edema and sinus congestion. TTP over bilateral maxillary sinuses. OP clear without tonsillar swelling, mild erythema.  Eyes: Conjunctivae are normal.  Neck: Neck supple.  Cardiovascular: Normal rate, regular rhythm and normal heart sounds.  Exam reveals no friction rub.   No murmur heard. Pulmonary/Chest: Effort normal and breath sounds normal. No respiratory distress. He has no wheezes. He has no rales.  Abdominal: He exhibits no distension.  Musculoskeletal: He exhibits no edema.  Mild TTP over left upper arm. No bruising or deformity.  Neurological: He is alert and oriented to person, place, and time. He exhibits normal muscle tone.  Skin: Skin is warm. Capillary refill takes less than 2 seconds.  Psychiatric: He has a normal mood and affect.  Nursing note and vitals reviewed.   LOWER EXTREMITY EXAM: RIGHT  INSPECTION & PALPATION: Significant chronic bony changes to right knee. Mild effusion. pROM is minimally to non-painful but he has significant pain with bearing weight. No redness. Mild warmth.  SENSORY: sensation is intact to light touch in:  Superficial peroneal nerve distribution (over dorsum of foot) Deep peroneal nerve distribution (over first dorsal web space) Sural nerve distribution (over lateral aspect 5th metatarsal) Saphenous nerve distribution (over medial instep)  MOTOR:  + Motor EHL (great toe dorsiflexion) + FHL (great toe plantar flexion)  + TA (ankle dorsiflexion)  + GSC (ankle plantar flexion)  VASCULAR: 2+ dorsalis pedis and posterior tibialis pulses Capillary refill < 2 sec, toes warm and well-perfused  COMPARTMENTS: Soft, warm, well-perfused No pain with passive extension No parethesias    ED Treatments / Results  Labs (all labs ordered are listed, but only abnormal results are displayed) Labs Reviewed  CBC WITH DIFFERENTIAL/PLATELET - Abnormal; Notable for the following:       Result Value   WBC 13.5 (*)    RBC 4.16 (*)     Hemoglobin 12.2 (*)    HCT 36.6 (*)    Neutro Abs 10.4 (*)    Monocytes Absolute 1.5 (*)    All other components within normal limits  COMPREHENSIVE METABOLIC PANEL - Abnormal; Notable for the following:    Glucose, Bld 163 (*)    ALT 10 (*)    All other components within normal limits  URINALYSIS, ROUTINE W REFLEX MICROSCOPIC - Abnormal; Notable for the following:    Hgb urine dipstick MODERATE (*)    Ketones, ur 5 (*)    Protein, ur 30 (*)    Squamous Epithelial / LPF 0-5 (*)    All other components within normal limits  PREALBUMIN - Abnormal; Notable for the following:    Prealbumin 13.0 (*)    All other components within normal limits  COMPREHENSIVE METABOLIC PANEL - Abnormal; Notable for the following:    Potassium 3.4 (*)    Glucose, Bld 170 (*)    Calcium 8.5 (*)    Total Protein 6.2 (*)  Albumin 3.3 (*)    ALT 10 (*)    All other components within normal limits  CBC - Abnormal; Notable for the following:    RBC 3.85 (*)    Hemoglobin 11.3 (*)    HCT 33.7 (*)    All other components within normal limits  I-STAT CG4 LACTIC ACID, ED - Abnormal; Notable for the following:    Lactic Acid, Venous 2.39 (*)    All other components within normal limits  CULTURE, BLOOD (ROUTINE X 2)  CULTURE, BLOOD (ROUTINE X 2)  RESPIRATORY PANEL BY PCR  CK  MAGNESIUM  PHOSPHORUS  TSH  LACTIC ACID, PLASMA  LACTIC ACID, PLASMA  PROCALCITONIN  PROTIME-INR  APTT  HEMOGLOBIN A1C  I-STAT CG4 LACTIC ACID, ED  I-STAT TROPONIN, ED    EKG  EKG Interpretation None       Radiology Dg Chest 2 View  Result Date: 09/27/2016 CLINICAL DATA:  [place] 2 days ago, altered mental status EXAM: CHEST  2 VIEW COMPARISON:  05/01/2007 FINDINGS: Mildly low lung volumes. Linear scar or atelectasis at the medial left lung base. Borderline to mild cardiomegaly. Aortic atherosclerosis. No pneumothorax. Degenerative changes at both shoulders and within the spine. IMPRESSION: No active cardiopulmonary  disease. Borderline to mild cardiomegaly. Scarring or atelectasis at the medial left lung base Electronically Signed   By: Donavan Foil M.D.   On: 09/27/2016 22:21   Abd 1 View (kub)  Result Date: 09/28/2016 CLINICAL DATA:  Acute onset of generalized abdominal distention and pain. Initial encounter. EXAM: ABDOMEN - 1 VIEW COMPARISON:  None. FINDINGS: The visualized bowel gas pattern is unremarkable. Scattered air and stool filled loops of colon are seen; no abnormal dilatation of small bowel loops is seen to suggest small bowel obstruction. No free intra-abdominal air is identified, though evaluation for free air is limited on a single supine view. Degenerative change is noted at the lower lumbar spine; the sacroiliac joints are unremarkable in appearance. The visualized lung bases are essentially clear. IMPRESSION: Unremarkable bowel gas pattern; no free intra-abdominal air seen. Moderate amount of stool noted in the colon. Electronically Signed   By: Garald Balding M.D.   On: 09/28/2016 03:34   Ct Head Wo Contrast  Result Date: 09/27/2016 CLINICAL DATA:  Trip and fall yesterday. EXAM: CT HEAD WITHOUT CONTRAST CT CERVICAL SPINE WITHOUT CONTRAST TECHNIQUE: Multidetector CT imaging of the head and cervical spine was performed following the standard protocol without intravenous contrast. Multiplanar CT image reconstructions of the cervical spine were also generated. COMPARISON:  None. FINDINGS: CT HEAD FINDINGS Brain: No evidence of acute infarction, hemorrhage, hydrocephalus, extra-axial collection or mass lesion/mass effect. Global cerebral and cerebellar atrophy. Mild to moderate chronic small vessel ischemia. Vascular: Atherosclerosis of skullbase vasculature without hyperdense vessel or abnormal calcification. Skull: No skull fracture.  No focal lesion. Sinuses/Orbits: Mucosal thickening in the left frontal sinus, ethmoid air cells, left maxillary sinus and sphenoid sinuses. No fluid levels. Mastoid air  cells are clear. Left globe prosthesis. Other: None. CT CERVICAL SPINE FINDINGS Alignment: Straightening of lordosis.  No traumatic subluxation. Skull base and vertebrae: No acute fracture. Vertebral body heights are maintained. The dens and skull base are intact. Soft tissues and spinal canal: No prevertebral fluid or swelling. No visible canal hematoma. Disc levels: Diffuse disc space narrowing and endplate spurring, most prominent at C4-C5. Degenerative change at C1-C2. Multilevel facet arthropathy. Upper chest: No acute abnormality. Other: Carotid and aortic arch atherosclerosis. IMPRESSION: 1. No acute intracranial abnormality. No skull  fracture. Atrophy and chronic small vessel ischemia. 2. Multilevel degenerative change throughout the cervical spine without acute fracture or subluxation. 3. Incidental findings of paranasal sinus inflammation and carotid vascular calcifications. Electronically Signed   By: Jeb Levering M.D.   On: 09/27/2016 22:10   Ct Cervical Spine Wo Contrast  Result Date: 09/27/2016 CLINICAL DATA:  Trip and fall yesterday. EXAM: CT HEAD WITHOUT CONTRAST CT CERVICAL SPINE WITHOUT CONTRAST TECHNIQUE: Multidetector CT imaging of the head and cervical spine was performed following the standard protocol without intravenous contrast. Multiplanar CT image reconstructions of the cervical spine were also generated. COMPARISON:  None. FINDINGS: CT HEAD FINDINGS Brain: No evidence of acute infarction, hemorrhage, hydrocephalus, extra-axial collection or mass lesion/mass effect. Global cerebral and cerebellar atrophy. Mild to moderate chronic small vessel ischemia. Vascular: Atherosclerosis of skullbase vasculature without hyperdense vessel or abnormal calcification. Skull: No skull fracture.  No focal lesion. Sinuses/Orbits: Mucosal thickening in the left frontal sinus, ethmoid air cells, left maxillary sinus and sphenoid sinuses. No fluid levels. Mastoid air cells are clear. Left globe  prosthesis. Other: None. CT CERVICAL SPINE FINDINGS Alignment: Straightening of lordosis.  No traumatic subluxation. Skull base and vertebrae: No acute fracture. Vertebral body heights are maintained. The dens and skull base are intact. Soft tissues and spinal canal: No prevertebral fluid or swelling. No visible canal hematoma. Disc levels: Diffuse disc space narrowing and endplate spurring, most prominent at C4-C5. Degenerative change at C1-C2. Multilevel facet arthropathy. Upper chest: No acute abnormality. Other: Carotid and aortic arch atherosclerosis. IMPRESSION: 1. No acute intracranial abnormality. No skull fracture. Atrophy and chronic small vessel ischemia. 2. Multilevel degenerative change throughout the cervical spine without acute fracture or subluxation. 3. Incidental findings of paranasal sinus inflammation and carotid vascular calcifications. Electronically Signed   By: Jeb Levering M.D.   On: 09/27/2016 22:10   Dg Knee Complete 4 Views Right  Result Date: 09/27/2016 CLINICAL DATA:  [place] 2 days ago with knee pain EXAM: RIGHT KNEE - COMPLETE 4+ VIEW COMPARISON:  05/06/2003 FINDINGS: Moderate severe arthritis of the right knee involving the patellofemoral, medial and lateral compartments. Prominent bony spurring at the distal femur. Probable joint space calcification laterally. No fracture or malalignment. Small suprapatellar joint effusion. Vascular calcifications. IMPRESSION: 1. Advanced arthritis of the knee. 2. No definite acute osseous abnormality 3. Small suprapatellar joint effusion Electronically Signed   By: Donavan Foil M.D.   On: 09/27/2016 22:26   Dg Humerus Left  Result Date: 09/27/2016 CLINICAL DATA:  Fall with pain EXAM: LEFT HUMERUS - 2+ VIEW COMPARISON:  None. FINDINGS: AC joint and glenohumeral degenerative changes. No fracture or malalignment. Soft tissues are unremarkable. IMPRESSION: No acute osseous abnormality Electronically Signed   By: Donavan Foil M.D.   On:  09/27/2016 22:22   Dg Hip Unilat W Or Wo Pelvis 2-3 Views Right  Result Date: 09/27/2016 CLINICAL DATA:  Fall with hip pain EXAM: DG HIP (WITH OR WITHOUT PELVIS) 2-3V RIGHT COMPARISON:  None. FINDINGS: SI joint arthritis. Pubic symphysis is intact. Pubic rami are within normal limits. Both femoral heads project in joint. No acute displaced fracture or malalignment is seen. IMPRESSION: Note acute osseous abnormality. Electronically Signed   By: Donavan Foil M.D.   On: 09/27/2016 22:24   Dg Femur Min 2 Views Right  Result Date: 09/27/2016 CLINICAL DATA:  Fall with hip pain EXAM: RIGHT FEMUR 2 VIEWS COMPARISON:  None. FINDINGS: There is no evidence of fracture or other focal bone lesions. Soft tissues are unremarkable.  Vascular calcifications IMPRESSION: No acute osseous abnormality Electronically Signed   By: Donavan Foil M.D.   On: 09/27/2016 22:25    Procedures Procedures (including critical care time)  Medications Ordered in ED Medications  bicalutamide (CASODEX) tablet 50 mg (50 mg Oral Given 09/28/16 0942)  simvastatin (ZOCOR) tablet 40 mg (40 mg Oral Given 09/28/16 0337)  acetaminophen (TYLENOL) tablet 650 mg (650 mg Oral Given 09/28/16 0337)    Or  acetaminophen (TYLENOL) suppository 650 mg ( Rectal See Alternative 09/28/16 0337)  HYDROcodone-acetaminophen (NORCO/VICODIN) 5-325 MG per tablet 1-2 tablet (not administered)  ondansetron (ZOFRAN) tablet 4 mg (not administered)    Or  ondansetron (ZOFRAN) injection 4 mg (not administered)  0.9 %  sodium chloride infusion ( Intravenous New Bag/Given 09/28/16 0309)  senna (SENOKOT) tablet 8.6 mg (8.6 mg Oral Given 09/28/16 0942)  polyethylene glycol (MIRALAX / GLYCOLAX) packet 17 g (not administered)  bisacodyl (DULCOLAX) suppository 10 mg (not administered)  vancomycin (VANCOCIN) IVPB 1000 mg/200 mL premix (not administered)  piperacillin-tazobactam (ZOSYN) IVPB 3.375 g (3.375 g Intravenous New Bag/Given 09/28/16 4854)  sodium chloride 0.9 %  bolus 1,000 mL (0 mLs Intravenous Stopped 09/28/16 0015)  0.9 %  sodium chloride infusion ( Intravenous New Bag/Given 09/28/16 0100)  piperacillin-tazobactam (ZOSYN) IVPB 3.375 g (0 g Intravenous Stopped 09/28/16 0144)  vancomycin (VANCOCIN) IVPB 1000 mg/200 mL premix (0 mg Intravenous Stopped 09/28/16 0427)  potassium chloride SA (K-DUR,KLOR-CON) CR tablet 40 mEq (40 mEq Oral Given 09/28/16 0940)     Initial Impression / Assessment and Plan / ED Course  I have reviewed the triage vital signs and the nursing notes.  Pertinent labs & imaging results that were available during my care of the patient were reviewed by me and considered in my medical decision making (see chart for details).     83 yo M with PMHx as above here with generalized weakness and fall. On arrival, pt tachycardic, appears dehydrated and generally weak. Lab work concerning for dehydration, possible SIRS/early sepsis with WBC 13.5k, LA 2.4, ketonuria. His only sx are sinus sx/fullness - suspect sinusitis, will tx with Unasyn. Otherwise, imaging neg for fx. He has a mild suprapatellar effusion of R knee but denies ANY pain of knee before fall, has painless passive ROM, no erythema, do not suspect septic knee. Given age, inability to ambulate, and dehydration, will admit for IVF, monitoring, pain control and possible PT/OT.  Final Clinical Impressions(s) / ED Diagnoses   Final diagnoses:  SIRS (systemic inflammatory response syndrome) ([place])  Fall, initial encounter    New Prescriptions Current Discharge Medication List       Duffy Bruce, MD 09/28/16 1030    Duffy Bruce, MD 09/28/16 1030

## 2016-09-28 ENCOUNTER — Observation Stay (HOSPITAL_COMMUNITY): Payer: Medicare Other

## 2016-09-28 ENCOUNTER — Encounter (HOSPITAL_COMMUNITY): Payer: Self-pay | Admitting: Internal Medicine

## 2016-09-28 DIAGNOSIS — R7989 Other specified abnormal findings of blood chemistry: Secondary | ICD-10-CM | POA: Diagnosis not present

## 2016-09-28 DIAGNOSIS — R140 Abdominal distension (gaseous): Secondary | ICD-10-CM | POA: Diagnosis not present

## 2016-09-28 DIAGNOSIS — R6510 Systemic inflammatory response syndrome (SIRS) of non-infectious origin without acute organ dysfunction: Secondary | ICD-10-CM | POA: Diagnosis not present

## 2016-09-28 DIAGNOSIS — E860 Dehydration: Secondary | ICD-10-CM | POA: Diagnosis not present

## 2016-09-28 DIAGNOSIS — I10 Essential (primary) hypertension: Secondary | ICD-10-CM | POA: Diagnosis present

## 2016-09-28 DIAGNOSIS — R319 Hematuria, unspecified: Secondary | ICD-10-CM | POA: Diagnosis present

## 2016-09-28 DIAGNOSIS — C61 Malignant neoplasm of prostate: Secondary | ICD-10-CM | POA: Diagnosis not present

## 2016-09-28 DIAGNOSIS — J0130 Acute sphenoidal sinusitis, unspecified: Secondary | ICD-10-CM | POA: Diagnosis not present

## 2016-09-28 DIAGNOSIS — J329 Chronic sinusitis, unspecified: Secondary | ICD-10-CM | POA: Diagnosis present

## 2016-09-28 LAB — PROTIME-INR
INR: 1.11
PROTHROMBIN TIME: 14.3 s (ref 11.4–15.2)

## 2016-09-28 LAB — RESPIRATORY PANEL BY PCR
ADENOVIRUS-RVPPCR: NOT DETECTED
BORDETELLA PERTUSSIS-RVPCR: NOT DETECTED
CORONAVIRUS HKU1-RVPPCR: NOT DETECTED
CORONAVIRUS NL63-RVPPCR: NOT DETECTED
Chlamydophila pneumoniae: NOT DETECTED
Coronavirus 229E: NOT DETECTED
Coronavirus OC43: NOT DETECTED
INFLUENZA A H3-RVPPCR: NOT DETECTED
INFLUENZA B-RVPPCR: NOT DETECTED
Influenza A H1 2009: NOT DETECTED
Influenza A H1: NOT DETECTED
Influenza A: NOT DETECTED
METAPNEUMOVIRUS-RVPPCR: NOT DETECTED
MYCOPLASMA PNEUMONIAE-RVPPCR: NOT DETECTED
Parainfluenza Virus 1: NOT DETECTED
Parainfluenza Virus 2: NOT DETECTED
Parainfluenza Virus 3: NOT DETECTED
Parainfluenza Virus 4: NOT DETECTED
RHINOVIRUS / ENTEROVIRUS - RVPPCR: NOT DETECTED
Respiratory Syncytial Virus: NOT DETECTED

## 2016-09-28 LAB — I-STAT CG4 LACTIC ACID, ED: Lactic Acid, Venous: 1.77 mmol/L (ref 0.5–1.9)

## 2016-09-28 LAB — COMPREHENSIVE METABOLIC PANEL
ALBUMIN: 3.3 g/dL — AB (ref 3.5–5.0)
ALT: 10 U/L — ABNORMAL LOW (ref 17–63)
ANION GAP: 7 (ref 5–15)
AST: 20 U/L (ref 15–41)
Alkaline Phosphatase: 47 U/L (ref 38–126)
BILIRUBIN TOTAL: 1 mg/dL (ref 0.3–1.2)
BUN: 14 mg/dL (ref 6–20)
CHLORIDE: 111 mmol/L (ref 101–111)
CO2: 22 mmol/L (ref 22–32)
Calcium: 8.5 mg/dL — ABNORMAL LOW (ref 8.9–10.3)
Creatinine, Ser: 0.76 mg/dL (ref 0.61–1.24)
GFR calc Af Amer: >60 mL/min (ref >60)
GFR calc non Af Amer: >60 mL/min (ref >60)
GLUCOSE: 170 mg/dL — AB (ref 65–99)
POTASSIUM: 3.4 mmol/L — AB (ref 3.5–5.1)
Sodium: 140 mmol/L (ref 135–145)
TOTAL PROTEIN: 6.2 g/dL — AB (ref 6.5–8.1)

## 2016-09-28 LAB — I-STAT TROPONIN, ED: Troponin i, poc: 0.01 ng/mL (ref 0.00–0.08)

## 2016-09-28 LAB — CBC
HEMATOCRIT: 33.7 % — AB (ref 39.0–52.0)
HEMOGLOBIN: 11.3 g/dL — AB (ref 13.0–17.0)
MCH: 29.4 pg (ref 26.0–34.0)
MCHC: 33.5 g/dL (ref 30.0–36.0)
MCV: 87.5 fL (ref 78.0–100.0)
Platelets: 239 10*3/uL (ref 150–400)
RBC: 3.85 MIL/uL — ABNORMAL LOW (ref 4.22–5.81)
RDW: 14.9 % (ref 11.5–15.5)
WBC: 9.6 10*3/uL (ref 4.0–10.5)

## 2016-09-28 LAB — APTT: APTT: 30 s (ref 24–36)

## 2016-09-28 LAB — LACTIC ACID, PLASMA
LACTIC ACID, VENOUS: 1.8 mmol/L (ref 0.5–1.9)
LACTIC ACID, VENOUS: 1.4 mmol/L (ref 0.5–1.9)

## 2016-09-28 LAB — MAGNESIUM: MAGNESIUM: 1.9 mg/dL (ref 1.7–2.4)

## 2016-09-28 LAB — PREALBUMIN: PREALBUMIN: 13 mg/dL — AB (ref 18–38)

## 2016-09-28 LAB — PROCALCITONIN: Procalcitonin: <0.1 ng/mL

## 2016-09-28 LAB — PHOSPHORUS: PHOSPHORUS: 2.7 mg/dL (ref 2.5–4.6)

## 2016-09-28 LAB — HEMOGLOBIN A1C
Hgb A1c MFr Bld: 6.1 % — ABNORMAL HIGH (ref 4.8–5.6)
MEAN PLASMA GLUCOSE: 128.37 mg/dL

## 2016-09-28 LAB — TSH: TSH: 2.594 u[IU]/mL (ref 0.350–4.500)

## 2016-09-28 LAB — CK: Total CK: 114 U/L (ref 49–397)

## 2016-09-28 MED ORDER — HYDROCODONE-ACETAMINOPHEN 5-325 MG PO TABS
1.0000 | ORAL_TABLET | ORAL | Status: DC | PRN
  Administered 2016-09-28: 1 via ORAL
  Filled 2016-09-28: qty 1

## 2016-09-28 MED ORDER — POTASSIUM CHLORIDE CRYS ER 20 MEQ PO TBCR
40.0000 meq | EXTENDED_RELEASE_TABLET | Freq: Once | ORAL | Status: AC
  Administered 2016-09-28: 40 meq via ORAL
  Filled 2016-09-28: qty 2

## 2016-09-28 MED ORDER — PIPERACILLIN-TAZOBACTAM 3.375 G IVPB
3.3750 g | Freq: Three times a day (TID) | INTRAVENOUS | Status: DC
Start: 2016-09-28 — End: 2016-09-28
  Administered 2016-09-28: 3.375 g via INTRAVENOUS
  Filled 2016-09-28 (×2): qty 50

## 2016-09-28 MED ORDER — SODIUM CHLORIDE 0.9 % IV SOLN
INTRAVENOUS | Status: DC
  Administered 2016-09-28: 03:00:00 via INTRAVENOUS

## 2016-09-28 MED ORDER — AMOXICILLIN-POT CLAVULANATE 875-125 MG PO TABS
1.0000 | ORAL_TABLET | Freq: Two times a day (BID) | ORAL | Status: DC
  Administered 2016-09-28 – 2016-09-30 (×4): 1 via ORAL
  Filled 2016-09-28 (×4): qty 1

## 2016-09-28 MED ORDER — SIMVASTATIN 40 MG PO TABS
40.0000 mg | ORAL_TABLET | Freq: Every day | ORAL | Status: DC
  Administered 2016-09-28 – 2016-09-29 (×3): 40 mg via ORAL
  Filled 2016-09-28 (×3): qty 1

## 2016-09-28 MED ORDER — POLYETHYLENE GLYCOL 3350 17 G PO PACK: 17.0000 g | PACK | Freq: Every day | ORAL | Status: DC | PRN

## 2016-09-28 MED ORDER — BICALUTAMIDE 50 MG PO TABS
50.0000 mg | ORAL_TABLET | Freq: Every day | ORAL | Status: DC
  Administered 2016-09-28 – 2016-09-30 (×3): 50 mg via ORAL
  Filled 2016-09-28 (×3): qty 1

## 2016-09-28 MED ORDER — ACETAMINOPHEN 650 MG RE SUPP: 650.0000 mg | Freq: Four times a day (QID) | RECTAL | Status: DC | PRN

## 2016-09-28 MED ORDER — ACETAMINOPHEN 325 MG PO TABS
650.0000 mg | ORAL_TABLET | Freq: Four times a day (QID) | ORAL | Status: DC | PRN
  Administered 2016-09-28: 650 mg via ORAL
  Filled 2016-09-28: qty 2

## 2016-09-28 MED ORDER — BISACODYL 10 MG RE SUPP: 10.0000 mg | Freq: Every day | RECTAL | Status: DC | PRN

## 2016-09-28 MED ORDER — SODIUM CHLORIDE 0.9 % IV SOLN
Freq: Once | INTRAVENOUS | Status: AC
  Administered 2016-09-28: 01:00:00 via INTRAVENOUS

## 2016-09-28 MED ORDER — SENNA 8.6 MG PO TABS
1.0000 | ORAL_TABLET | Freq: Two times a day (BID) | ORAL | Status: DC
  Administered 2016-09-28 – 2016-09-30 (×6): 8.6 mg via ORAL
  Filled 2016-09-28 (×6): qty 1

## 2016-09-28 MED ORDER — PIPERACILLIN-TAZOBACTAM 3.375 G IVPB 30 MIN
3.3750 g | Freq: Once | INTRAVENOUS | Status: AC
  Administered 2016-09-28: 3.375 g via INTRAVENOUS
  Filled 2016-09-28: qty 50

## 2016-09-28 MED ORDER — ONDANSETRON HCL 4 MG PO TABS
4.0000 mg | ORAL_TABLET | Freq: Four times a day (QID) | ORAL | Status: DC | PRN
Start: 2016-09-28 — End: 2016-09-30

## 2016-09-28 MED ORDER — ONDANSETRON HCL 4 MG/2ML IJ SOLN: 4.0000 mg | Freq: Four times a day (QID) | INTRAMUSCULAR | Status: DC | PRN

## 2016-09-28 MED ORDER — VANCOMYCIN HCL IN DEXTROSE 1-5 GM/200ML-% IV SOLN: 1000.0000 mg | Freq: Two times a day (BID) | INTRAVENOUS | Status: DC

## 2016-09-28 MED ORDER — VANCOMYCIN HCL IN DEXTROSE 1-5 GM/200ML-% IV SOLN
1000.0000 mg | Freq: Once | INTRAVENOUS | Status: AC
  Administered 2016-09-28: 1000 mg via INTRAVENOUS
  Filled 2016-09-28: qty 200

## 2016-09-28 NOTE — H&P (Signed)
Lawrence Douglas ZMO:[id number] DOB: [birth year] DOA: 09/27/2016     PCP: [name], PA-C   Outpatient Specialists: Urology   Patient coming from:   home Lives  With family     Chief Complaint: Multiple falls, feeling generalized weakness  HPI: Lawrence Douglas is a 83 y.o. male with medical history significant of HTN    Presented with multiple falls over the past 4 days he had had a total of 4 falls today he was holding onto the wall and fell down on the right knee he has not been able to bear weight since the fall secondary to pain he did not hit his head he overall has been feeling fatigued. Been having sinus pressure and nasal congestion and URI symptoms Patient denies chest pain or shortness of breath. Denies syncope or palpitations Reports decreased by mouth intake  He has not taken his home medications today  He's been feeling a little lightheaded have had some constipation and abdominal distention  Regarding pertinent Chronic problems: History of hypertension on enalapril and amlodipine   IN ER:  Temp (24hrs), Avg:98.4 F (36.9 C), Min:98.4 F (36.9 C), Max:98.4 F (36.9 C)      on arrival  ED Triage Vitals  Enc Vitals Group     BP 09/27/16 2029 127/81     Pulse Rate 09/27/16 2029 (!) 113     Resp 09/27/16 2029 18     Temp 09/27/16 2029 98.4 F (36.9 C)     Temp Source 09/27/16 2029 Oral     SpO2 09/27/16 2029 97 %     Weight 09/27/16 2030 191 lb (86.6 kg)     Height 09/27/16 2030 5\' 7"  (1.702 m)     Head Circumference --      Peak Flow --      Pain Score 09/27/16 2041 7     Pain Loc --      Pain Edu? --      Excl. in GC? --   Respirations 20 satting 98% heart rate 100 BP 146/72 Lactic acid 2.39 WBC 13.5 hemoglobin 12.2 sodium 141K4.0 CR 0.90 Plain imaging showing advanced arthritis of the knee small joint effusion Left humerus no fractures Right femur no evidence of fracture ET head nonacute but does show sinusitis and carotid vascular  calcifications  chest x-ray showing atelectasis   Following Medications were ordered in ER: Medications  Ampicillin-Sulbactam (UNASYN) 3 g in sodium chloride 0.9 % 100 mL IVPB (not administered)  0.9 %  sodium chloride infusion (not administered)  sodium chloride 0.9 % bolus 1,000 mL (0 mLs Intravenous Stopped 09/28/16 0015)    Hospitalist was called for admission for SIRS  and dehydration possible sinusitis Review of Systems:    Pertinent positives include:  fatigue, falls  Constitutional:  No weight loss, night sweats, Fevers, chills,weight loss  HEENT:  No headaches, Difficulty swallowing,Tooth/dental problems,Sore throat,  No sneezing, itching, ear ache, nasal congestion, post nasal drip,  Cardio-vascular:  No chest pain, Orthopnea, PND, anasarca, dizziness, palpitations.no Bilateral lower extremity swelling  GI:  No heartburn, indigestion, abdominal pain, nausea, vomiting, diarrhea, change in bowel habits, loss of appetite, melena, blood in stool, hematemesis Resp:  no shortness of breath at rest. No dyspnea on exertion, No excess mucus, no productive cough, No non-productive cough, No coughing up of blood.No change in color of mucus.No wheezing. Skin:  no rash or lesions. No jaundice GU:  no dysuria, change in color of urine, no urgency or frequency.  No straining to urinate.  No flank pain.  Musculoskeletal:  No joint pain or no joint swelling. No decreased range of motion. No back pain.  Psych:  No change in mood or affect. No depression or anxiety. No memory loss.  Neuro: no localizing neurological complaints, no tingling, no weakness, no double vision, no gait abnormality, no slurred speech, no confusion  As per HPI otherwise 10 point review of systems negative.   Past Medical History: Past Medical History:  Diagnosis Date  . Hypertension    Past Surgical History:  Procedure Laterality Date  . HERNIA REPAIR  1977     Social History:  Ambulatory  walker         reports that he has never smoked. He has never used smokeless tobacco. He reports that he does not drink alcohol or use drugs.  Allergies:  No Known Allergies     Family History:   Family History  Problem Relation Age of Onset  . Stroke Father   . Leukemia Brother   . Stroke Brother   . Diabetes Daughter     Medications: Prior to Admission medications   Medication Sig Start Date End Date Taking? Authorizing Provider  amLODipine (NORVASC) 10 MG tablet Take 10 mg by mouth daily. 10/31/14  Yes [provider]  aspirin 81 MG tablet Take 81 mg by mouth daily.   Yes [provider]  bicalutamide (CASODEX) 50 MG tablet TAKE 1 TAB DAILY FOR PROSTATE CANCER 10/14/14  Yes [provider]  CALCIUM CARBONATE PO Take 2 tablets by mouth daily.    Yes [provider]  enalapril (VASOTEC) 20 MG tablet Take 20 mg by mouth daily.  10/31/14  Yes [provider]  Omega-3 Fatty Acids (FISH OIL) 600 MG CAPS Take 2 capsules by mouth daily.   Yes [provider]  simvastatin (ZOCOR) 40 MG tablet Take 40 mg by mouth at bedtime. 10/31/14  Yes [provider]    Physical Exam: Patient Vitals for the past 24 hrs:  BP Temp Temp src Pulse Resp SpO2 Height Weight  09/27/16 2252 (!) 146/72 98.4 F (36.9 C) Oral 100 20 98 % - -  09/27/16 2030 - - - - - - 5\' 7"  (1.702 m) 86.6 kg (191 lb)  09/27/16 2029 127/81 98.4 F (36.9 C) Oral (!) 113 18 97 % - -    1. General:  in No Acute distress 2. Psychological: Alert and   Oriented 3. Head/ENT:   Dry Mucous Membranes                          Head Non traumatic, neck supple                            Poor Dentition 4. SKIN  decreased Skin turgor,  Skin clean Dry and intact no rash 5. Heart: Regular rate and rhythm systolic  Murmur, no  Rub or gallop 6. Lungs:  no wheezes or crackles   7. Abdomen: Soft,  non-tender,  distended 8. Lower extremities: no clubbing, cyanosis, or edema 9.  Neurologically Grossly intact, moving all 4 extremities equally   10. MSK: Normal range of motion right knee pain   body mass index is 29.91 kg/m.  Labs on Admission:   Labs on Admission: I have personally reviewed following labs and imaging studies  CBC:  Recent Labs Lab 09/27/16 2221  WBC 13.5*  NEUTROABS 10.4*  HGB 12.2*  HCT 36.6*  MCV 88.0  PLT 510   Basic Metabolic Panel:  Recent Labs Lab 09/27/16 2221  NA 141  K 4.0  CL 110  CO2 23  GLUCOSE 163*  BUN 20  CREATININE 0.90  CALCIUM 9.3   GFR: Estimated Creatinine Clearance: 65.4 mL/min (by C-G formula based on SCr of 0.9 mg/dL). Liver Function Tests:  Recent Labs Lab 09/27/16 2221  AST 22  ALT 10*  ALKPHOS 52  BILITOT 0.5  PROT 6.8  ALBUMIN 3.7   No results for input(s): LIPASE, AMYLASE in the last 168 hours. No results for input(s): AMMONIA in the last 168 hours. Coagulation Profile: No results for input(s): INR, PROTIME in the last 168 hours. Cardiac Enzymes: No results for input(s): CKTOTAL, CKMB, CKMBINDEX, TROPONINI in the last 168 hours. BNP (last 3 results) No results for input(s): PROBNP in the last 8760 hours. HbA1C: No results for input(s): HGBA1C in the last 72 hours. CBG: No results for input(s): GLUCAP in the last 168 hours. Lipid Profile: No results for input(s): CHOL, HDL, LDLCALC, TRIG, CHOLHDL, LDLDIRECT in the last 72 hours. Thyroid Function Tests: No results for input(s): TSH, T4TOTAL, FREET4, T3FREE, THYROIDAB in the last 72 hours. Anemia Panel: No results for input(s): VITAMINB12, FOLATE, FERRITIN, TIBC, IRON, RETICCTPCT in the last 72 hours. Urine analysis:    Component Value Date/Time   COLORURINE YELLOW 09/27/2016 2310   APPEARANCEUR CLEAR 09/27/2016 2310   LABSPEC 1.024 09/27/2016 2310   PHURINE 5.0 09/27/2016 2310   GLUCOSEU NEGATIVE 09/27/2016 2310   HGBUR MODERATE (A) 09/27/2016 2310   BILIRUBINUR NEGATIVE 09/27/2016 2310   KETONESUR 5 (A) 09/27/2016 2310     PROTEINUR 30 (A) 09/27/2016 2310   UROBILINOGEN 0.2 04/11/2007 0159   NITRITE NEGATIVE 09/27/2016 2310   LEUKOCYTESUR NEGATIVE 09/27/2016 2310   Sepsis Labs: @LABRCNTIP (procalcitonin:4,lacticidven:4) )No results found for this or any previous visit (from the past 240 hour(s)).    UA   no evidence of UTI  Hematuria noted  No results found for: HGBA1C  Estimated Creatinine Clearance: 65.4 mL/min (by C-G formula based on SCr of 0.9 mg/dL).  BNP (last 3 results) No results for input(s): PROBNP in the last 8760 hours.   ECG REPORT  Independently reviewed Rate: 104  Rhythm: Sinus tachycardia with PVCs ST&T Change: No acute ischemic changes   QTC 458  Filed Weights   09/27/16 2030  Weight: 86.6 kg (191 lb)     Cultures: No results found for: SDES, [place], CULT, REPTSTATUS   Radiological Exams on Admission: Dg Chest 2 View  Result Date: 09/27/2016 CLINICAL DATA:  [place] 2 days ago, altered mental status EXAM: CHEST  2 VIEW COMPARISON:  05/01/2007 FINDINGS: Mildly low lung volumes. Linear scar or atelectasis at the medial left lung base. Borderline to mild cardiomegaly. Aortic atherosclerosis. No pneumothorax. Degenerative changes at both shoulders and within the spine. IMPRESSION: No active cardiopulmonary disease. Borderline to mild cardiomegaly. Scarring or atelectasis at the medial left lung base Electronically Signed   By: Donavan Foil M.D.   On: 09/27/2016 22:21   Ct Head Wo Contrast  Result Date: 09/27/2016 CLINICAL DATA:  Trip and fall yesterday. EXAM: CT HEAD WITHOUT CONTRAST CT CERVICAL SPINE WITHOUT CONTRAST TECHNIQUE: Multidetector CT imaging of the head and cervical spine was performed following the standard protocol without intravenous contrast. Multiplanar CT image reconstructions of the cervical spine were also generated. COMPARISON:  None. FINDINGS: CT HEAD FINDINGS Brain: No evidence of acute  infarction, hemorrhage, hydrocephalus, extra-axial collection or  mass lesion/mass effect. Global cerebral and cerebellar atrophy. Mild to moderate chronic small vessel ischemia. Vascular: Atherosclerosis of skullbase vasculature without hyperdense vessel or abnormal calcification. Skull: No skull fracture.  No focal lesion. Sinuses/Orbits: Mucosal thickening in the left frontal sinus, ethmoid air cells, left maxillary sinus and sphenoid sinuses. No fluid levels. Mastoid air cells are clear. Left globe prosthesis. Other: None. CT CERVICAL SPINE FINDINGS Alignment: Straightening of lordosis.  No traumatic subluxation. Skull base and vertebrae: No acute fracture. Vertebral body heights are maintained. The dens and skull base are intact. Soft tissues and spinal canal: No prevertebral fluid or swelling. No visible canal hematoma. Disc levels: Diffuse disc space narrowing and endplate spurring, most prominent at C4-C5. Degenerative change at C1-C2. Multilevel facet arthropathy. Upper chest: No acute abnormality. Other: Carotid and aortic arch atherosclerosis. IMPRESSION: 1. No acute intracranial abnormality. No skull fracture. Atrophy and chronic small vessel ischemia. 2. Multilevel degenerative change throughout the cervical spine without acute fracture or subluxation. 3. Incidental findings of paranasal sinus inflammation and carotid vascular calcifications. Electronically Signed   By: Jeb Levering M.D.   On: 09/27/2016 22:10   Ct Cervical Spine Wo Contrast  Result Date: 09/27/2016 CLINICAL DATA:  Trip and fall yesterday. EXAM: CT HEAD WITHOUT CONTRAST CT CERVICAL SPINE WITHOUT CONTRAST TECHNIQUE: Multidetector CT imaging of the head and cervical spine was performed following the standard protocol without intravenous contrast. Multiplanar CT image reconstructions of the cervical spine were also generated. COMPARISON:  None. FINDINGS: CT HEAD FINDINGS Brain: No evidence of acute infarction, hemorrhage, hydrocephalus, extra-axial collection or mass lesion/mass effect. Global  cerebral and cerebellar atrophy. Mild to moderate chronic small vessel ischemia. Vascular: Atherosclerosis of skullbase vasculature without hyperdense vessel or abnormal calcification. Skull: No skull fracture.  No focal lesion. Sinuses/Orbits: Mucosal thickening in the left frontal sinus, ethmoid air cells, left maxillary sinus and sphenoid sinuses. No fluid levels. Mastoid air cells are clear. Left globe prosthesis. Other: None. CT CERVICAL SPINE FINDINGS Alignment: Straightening of lordosis.  No traumatic subluxation. Skull base and vertebrae: No acute fracture. Vertebral body heights are maintained. The dens and skull base are intact. Soft tissues and spinal canal: No prevertebral fluid or swelling. No visible canal hematoma. Disc levels: Diffuse disc space narrowing and endplate spurring, most prominent at C4-C5. Degenerative change at C1-C2. Multilevel facet arthropathy. Upper chest: No acute abnormality. Other: Carotid and aortic arch atherosclerosis. IMPRESSION: 1. No acute intracranial abnormality. No skull fracture. Atrophy and chronic small vessel ischemia. 2. Multilevel degenerative change throughout the cervical spine without acute fracture or subluxation. 3. Incidental findings of paranasal sinus inflammation and carotid vascular calcifications. Electronically Signed   By: Jeb Levering M.D.   On: 09/27/2016 22:10   Dg Knee Complete 4 Views Right  Result Date: 09/27/2016 CLINICAL DATA:  [place] 2 days ago with knee pain EXAM: RIGHT KNEE - COMPLETE 4+ VIEW COMPARISON:  05/06/2003 FINDINGS: Moderate severe arthritis of the right knee involving the patellofemoral, medial and lateral compartments. Prominent bony spurring at the distal femur. Probable joint space calcification laterally. No fracture or malalignment. Small suprapatellar joint effusion. Vascular calcifications. IMPRESSION: 1. Advanced arthritis of the knee. 2. No definite acute osseous abnormality 3. Small suprapatellar joint effusion  Electronically Signed   By: Donavan Foil M.D.   On: 09/27/2016 22:26   Dg Humerus Left  Result Date: 09/27/2016 CLINICAL DATA:  Fall with pain EXAM: LEFT HUMERUS - 2+ VIEW COMPARISON:  None. FINDINGS: AC joint and  glenohumeral degenerative changes. No fracture or malalignment. Soft tissues are unremarkable. IMPRESSION: No acute osseous abnormality Electronically Signed   By: Donavan Foil M.D.   On: 09/27/2016 22:22   Dg Hip Unilat W Or Wo Pelvis 2-3 Views Right  Result Date: 09/27/2016 CLINICAL DATA:  Fall with hip pain EXAM: DG HIP (WITH OR WITHOUT PELVIS) 2-3V RIGHT COMPARISON:  None. FINDINGS: SI joint arthritis. Pubic symphysis is intact. Pubic rami are within normal limits. Both femoral heads project in joint. No acute displaced fracture or malalignment is seen. IMPRESSION: Note acute osseous abnormality. Electronically Signed   By: Donavan Foil M.D.   On: 09/27/2016 22:24   Dg Femur Min 2 Views Right  Result Date: 09/27/2016 CLINICAL DATA:  Fall with hip pain EXAM: RIGHT FEMUR 2 VIEWS COMPARISON:  None. FINDINGS: There is no evidence of fracture or other focal bone lesions. Soft tissues are unremarkable. Vascular calcifications IMPRESSION: No acute osseous abnormality Electronically Signed   By: Donavan Foil M.D.   On: 09/27/2016 22:25    Chart has been reviewed    Assessment/Plan   83 y.o. male with medical history significant of HTN    Admitted for SIRS  and dehydration possible sinusitis    Present on Admission:   . Elevated lactic acid level - possibly secondary to service versus dehydration. Rehydrate and follow . SIRS (systemic inflammatory response syndrome) (HCC) - source being unclear there is some evidence of sinusitis obtain blood cultures and urine culture respiratory panel for now cover broadly with IV antibiotics given elevated lactic acid . Sinusitis- patient respiratory complaints CT showing sinusitis given evidence of SIRS will cover antibiotics in the .  Prostate cancer [facility]) continue home medications with follow-up with urology regarding hematuria . Hematuria- patient has urology follow-up on Friday . Dehydration -  - we'll administer IV fluids and recheck orthostatics in the morning  . Essential hypertension allowed to receive hypertension for tonight restart home medications when able Right knee pain imaging showing no evidence of fracture - order pain management and PT OT evaluation Other plan as per orders.  DVT prophylaxis:  SCD   Code Status:  FULL COD as per patient   Family Communication:   Family   at  Bedside  plan of care was discussed with   Daughter,   Disposition Plan:   To home once workup is complete and patient is stable he may need rehabilitation will have PT evaluate                       Would benefit from PT/OT eval prior to DC   ordered  Nutrition    consulted                          Consults called: none  Admission status:   observation   Level of care   tele         I have spent a total of 56 min on this admission    Dezmon Conover 09/28/2016, 12:53 AM    [facility]  Pager [phone]   after 2 AM please page floor coverage PA If 7AM-7PM, please contact the day team taking care of the patient  [email or web address]  Password [email or web address]

## 2016-09-28 NOTE — Evaluation (Signed)
Occupational Therapy Evaluation Patient Details Name: Elward Nocera MRN: [id number] DOB: [birth year] Today's Date: 09/28/2016    History of Present Illness 83 y.o. male with medical history significant of HTN and admitted for SIRS and dehydration, possible sinusitis    Clinical Impression   Pt was admitted for the above.  Pt states he was mostly independent for adls prior to admission.  He had decreased standing tolerance with PT earlier today. Saw at bed level to assess adls.  Pt has painful L shoulder and R knee at this time which are impairing his abilities. He needs up to total +2 assistance for LB dressing and min to mod A for UB adls. Goals are for mod +2 assistance for toilet transfers and min A for UB adls.    Follow Up Recommendations  SNF vs 24/7 supervision and [employer]   Equipment Recommendations   (to be further assessed)    Recommendations for Other Services       Precautions / Restrictions Precautions Precautions: Fall Restrictions Weight Bearing Restrictions: No      Mobility Bed Mobility Overal bed mobility: Needs Assistance Bed Mobility: Supine to Sit;Sit to Supine Rolling: Max assist       Used rails  Transfers WITH PT Overall transfer level: Needs assistance Equipment used: Rolling walker (2 wheeled) Transfers: Sit to/from Stand Sit to Stand: Mod assist             Balance Overall balance assessment: Needs assistance;History of Falls          Standing balance-Leahy Scale: Zero                             ADL either performed or assessed with clinical judgement   ADL Overall ADL's : Needs assistance/impaired     Grooming: Set up;Bed level   Upper Body Bathing: Minimal assistance;Bed level   Lower Body Bathing: Maximal assistance;Bed level   Upper Body Dressing : Moderate assistance;Bed level   Lower Body Dressing: Total assistance;Bed level;+2 for physical assistance                 General ADL Comments:  Pt seen for bed level evaluation. NT present to change sheets.  Pt states he can't stand; needs extensive assistance and 2 caregivers are needed for adls to maintain him on side at bed level.  Pt is fearful of falling     Vision         Perception     Praxis      Pertinent Vitals/Pain Pain Assessment: Faces Faces Pain Scale: Hurts even more Pain Location: L arm and R knee Pain Descriptors / Indicators: Sore;Aching Pain Intervention(s): Limited activity within patient's tolerance;Monitored during session;Premedicated before session;Repositioned     Hand Dominance     Extremity/Trunk Assessment Upper Extremity Assessment Upper Extremity Assessment: Generalized weakness;LUE deficits/detail LUE Deficits / Details: c/o pain in L upper arm.  AAROM to 40 degrees   Lower Extremity Assessment Lower Extremity Assessment: Generalized weakness       Communication Communication Communication: HOH   Cognition Arousal/Alertness: Awake/alert Behavior During Therapy: WFL for tasks assessed/performed Overall Cognitive Status: Difficult to assess                                 General Comments: multimodal cues at times and hand over hand assist at times, may be due to [facility]  General Comments       Exercises     Shoulder Instructions      Home Living Family/patient expects to be discharged to:: Private residence Living Arrangements: Children Available Help at Discharge: Family;Available PRN/intermittently Type of Home: House Home Access: Stairs to enter [employer] of Steps: 4 Entrance Stairs-Rails: Right Home Layout: One level     Bathroom Shower/Tub: Tub/shower unit         Home Equipment: Environmental consultant - 2 wheels   Additional Comments: unsure of commode height:  pt HOH, answered some questions inappropriately, likely due to hearing      Prior Functioning/Environment Level of Independence: Independent with assistive device(s)                  OT Problem List: Decreased strength;Decreased activity tolerance;Decreased knowledge of use of DME or AE;Pain;Impaired UE functional use (balance NT)      OT Treatment/Interventions: Self-care/ADL training;DME and/or AE instruction;Patient/family education;Therapeutic activities (balance per PT notes)    OT Goals(Current goals can be found in the care plan section) Acute Rehab OT Goals Patient Stated Goal: none stated OT Goal Formulation: With patient Time For Goal Achievement: 10/05/16 Potential to Achieve Goals: Good ADL Goals Pt Will Transfer to Toilet: with mod assist;with +2 assist;squat pivot transfer;stand pivot transfer;bedside commode (or lateral to drop arm commode) Additional ADL Goal #1: pt will sit eob x 10 minutes and perform UB adls with min A Additional ADL Goal #2: Pt will go sit to stand with min +2 assistance and maintain for 2 minutes with min A for adls Additional ADL Goal #3: Pt will tolerate 2 sets of 5 AAROM to LUE to increase strength for adls  OT Frequency: Min 2X/week   Barriers to D/C:            Co-evaluation              AM-PAC PT "6 Clicks" Daily Activity     Outcome Measure Help from another person eating meals?: A Little Help from another person taking care of personal grooming?: A Little Help from another person toileting, which includes using toliet, bedpan, or urinal?: A Lot Help from another person bathing (including washing, rinsing, drying)?: A Lot Help from another person to put on and taking off regular upper body clothing?: A Lot Help from another person to put on and taking off regular lower body clothing?: Total 6 Click Score: 13   End of Session    Activity Tolerance: Patient tolerated treatment well Patient left: in bed;with call bell/phone within reach;with bed alarm set  OT Visit Diagnosis: Muscle weakness (generalized) (M62.81);Pain Pain - part of body: Shoulder;Knee (Left shoulder; R knee)                Time:  2947-6546 OT Time Calculation (min): 22 min Charges:  OT General Charges $OT Visit: 1 Procedure OT Evaluation $OT Eval Low Complexity: 1 Procedure G-Codes: OT G-codes **NOT FOR INPATIENT CLASS** Functional Assessment Tool Used: Clinical judgement Functional Limitation: Self care Self Care Current Status (T0354): At least 80 percent but less than 100 percent impaired, limited or restricted Self Care Goal Status (S5681): At least 40 percent but less than 60 percent impaired, limited or restricted   Lesle Chris, OTR/L [phone] 09/28/2016  Tayari Yankee 09/28/2016, 3:37 PM

## 2016-09-28 NOTE — Progress Notes (Signed)
Initial Nutrition Assessment  DOCUMENTATION CODES:   Non-severe (moderate) malnutrition in context of chronic illness  INTERVENTION:   Ensure Enlive po BID, each supplement provides 350 kcal and 20 grams of protein  Re-check PAB  NUTRITION DIAGNOSIS:   Malnutrition (Moderate) related to chronic illness (prostate cancer/advanced age) as evidenced by moderate depletion of body fat, moderate depletions of muscle mass.  GOAL:   Patient will meet greater than or equal to 90% of their needs  MONITOR:   PO intake, Supplement acceptance, Labs, Weight trends  REASON FOR ASSESSMENT:   Consult Assessment of nutrition requirement/status  ASSESSMENT:   Pt with PMH of HTN and prostate cancer. Presents this admission with SIRS and dehydration with possible sinusitis.   Pt reports having a great appetite prior to and during this admission. He consumed 100% of his last meal. States he eats three meals a day at home, and does not use protein/calorie supplementation. Suspect appetite has decreased though hard to quantify with given recall. No family members at bedside to clarify. Nurse unsure of appetite prior to admission as well.   Wt history limited in records the last wt recorded being 189 lb in 2016. Pt reports a UBW of 190 lb, which he currently weighs this admission. Pt does not seem as if he has had significant wt loss.   Nutrition-Focused physical exam completed. Findings are moderate fat depletion, moderate muscle depletion, and moderate edema in BLE. Physical findings show moderate losses, which could be from contributing advanced age. Will provide pt with supplementation and monitor PO intake.   Prealbumin and albumin noted to be low. Albumin has a half-life of 21 days and is strongly affected by stress response and inflammatory process, therefore, do not expect to see an improvement in this lab value during acute hospitalization. Would recommend checking pre albumin in 3 days to  check for any decreasing results, as pre albumin has shorter half life.   Medications reviewed and include: K Dur, NS @ 100 ml/hr, IV abx Labs reviewed: K 3.4 (L) CBG 170 Albumin 3.3 (L) ALT 10 (L) PAB 13 (L)  Diet Order:  Diet Heart Room service appropriate? Yes; Fluid consistency: Thin  Skin:  Reviewed, no issues  Last BM:  09/27/16  Height:   Ht Readings from Last 1 Encounters:  09/28/16 5\' 7"  (1.702 m)    Weight:   Wt Readings from Last 1 Encounters:  09/28/16 189 lb 13.1 oz (86.1 kg)    Ideal Body Weight:  67.3 kg  BMI:  Body mass index is 29.73 kg/m.  Estimated Nutritional Needs:   Kcal:  1600-1800 (MSJ)  Protein:  100-110 grams (1.5-1.6 g/kg)  Fluid:  >1.6 L/day  EDUCATION NEEDS:   No education needs identified at this time  [place], LDN Clinical Nutrition Pager # - [phone]

## 2016-09-28 NOTE — Evaluation (Signed)
Physical Therapy Evaluation Patient Details Name: Lawrence Douglas MRN: [id number] DOB: August 22, 1931 Today's Date: 09/28/2016   History of Present Illness  83 y.o. male with medical history significant of HTN and admitted for SIRS and dehydration, possible sinusitis   Clinical Impression  Pt admitted with above diagnosis. Pt currently with functional limitations due to the deficits listed below (see PT Problem List).  Pt will benefit from skilled PT to increase their independence and safety with mobility to allow discharge to the venue listed below.  Pt assisted to standing however very unsteady and feeling weak.  BP elevated so returned to supine.  Pt would benefit from SNF upon d/c.     Follow Up Recommendations SNF;Supervision/Assistance - 24 hour    Equipment Recommendations  None recommended by PT    Recommendations for Other Services       Precautions / Restrictions Precautions Precautions: Fall Restrictions Weight Bearing Restrictions: No      Mobility  Bed Mobility Overal bed mobility: Needs Assistance Bed Mobility: Supine to Sit;Sit to Supine     Supine to sit: Mod assist;HOB elevated Sit to supine: Max assist   General bed mobility comments: verbal cues for technique, assist for trunk upright and scooting, assist for LEs onto bed and controlling trunk descent  Transfers Overall transfer level: Needs assistance Equipment used: Rolling walker (2 wheeled) Transfers: Sit to/from Stand Sit to Stand: Mod assist         General transfer comment: verbal cues for technique, assist to rise and steady, pt reports feeling unsteady and "woozy", orthostatics obtained by NT during session, BP standing 170/101 mmHg so pt assisted back to supine  Ambulation/Gait                Stairs            Wheelchair Mobility    Modified Rankin (Stroke Patients Only)       Balance Overall balance assessment: Needs assistance;History of Falls         Standing  balance support: Bilateral upper extremity supported Standing balance-Leahy Scale: Zero                               Pertinent Vitals/Pain Pain Assessment: Faces Faces Pain Scale: Hurts even more Pain Location: R LE Pain Descriptors / Indicators: Sore;Aching Pain Intervention(s): Limited activity within patient's tolerance;Monitored during session;Repositioned    Home Living Family/patient expects to be discharged to:: Private residence Living Arrangements: Children Available Help at Discharge: Family;Available PRN/intermittently Type of Home: House Home Access: Stairs to enter Entrance Stairs-Rails: Right Entrance Stairs-Number of Steps: 4 Home Layout: One level Home Equipment: Walker - 2 wheels      Prior Function Level of Independence: Independent with assistive device(s)               Hand Dominance        Extremity/Trunk Assessment        Lower Extremity Assessment Lower Extremity Assessment: Generalized weakness       Communication   Communication: HOH  Cognition Arousal/Alertness: Awake/alert Behavior During Therapy: WFL for tasks assessed/performed Overall Cognitive Status: Within Functional Limits for tasks assessed                                        General Comments      Exercises  Assessment/Plan    PT Assessment Patient needs continued PT services  PT Problem List Decreased mobility;Decreased strength;Decreased balance;Decreased activity tolerance;Decreased knowledge of use of DME       PT Treatment Interventions Gait training;DME instruction;Therapeutic activities;Therapeutic exercise;Functional mobility training;Patient/family education    PT Goals (Current goals can be found in the Care Plan section)  Acute Rehab PT Goals PT Goal Formulation: With patient Time For Goal Achievement: 10/12/16 Potential to Achieve Goals: Good    Frequency Min 3X/week   Barriers to discharge         Co-evaluation               AM-PAC PT "6 Clicks" Daily Activity  Outcome Measure Difficulty turning over in bed (including adjusting bedclothes, sheets and blankets)?: Total Difficulty moving from lying on back to sitting on the side of the bed? : Total Difficulty sitting down on and standing up from a chair with arms (e.g., wheelchair, bedside commode, etc,.)?: Total Help needed moving to and from a bed to chair (including a wheelchair)?: A Lot Help needed walking in hospital room?: Total Help needed climbing 3-5 steps with a railing? : Total 6 Click Score: 7    End of Session Equipment Utilized During Treatment: Gait belt Activity Tolerance: Other (comment) (limited by weakness and elevated BP) Patient left: in bed;with bed alarm set;with call bell/phone within reach Nurse Communication: Mobility status PT Visit Diagnosis: Other abnormalities of gait and mobility (R26.89)    Time: 7026-3785 PT Time Calculation (min) (ACUTE ONLY): 14 min   Charges:   PT Evaluation $PT Eval Low Complexity: 1 Low     PT G Codes:   PT G-Codes **NOT FOR INPATIENT CLASS** Functional Assessment Tool Used: Clinical judgement;AM-PAC 6 Clicks Basic Mobility Functional Limitation: Mobility: Walking and moving around Mobility: Walking and Moving Around Current Status (Y8502): At least 80 percent but less than 100 percent impaired, limited or restricted Mobility: Walking and Moving Around Goal Status [id number]): At least 20 percent but less than 40 percent impaired, limited or restricted    Carmelia Bake, PT, DPT 09/28/2016 Pager: [phone]   York Ram E 09/28/2016, 1:10 PM

## 2016-09-28 NOTE — Progress Notes (Signed)
Pharmacy Antibiotic Note  Lawrence Douglas is a 83 y.o. male admitted on 09/27/2016 with sepsis.  Pharmacy has been consulted for Vancomycin and Zosyn dosing.  Plan: Vancomycin 1gm IV every 12 hours.  Goal trough 15-20 mcg/mL. Zosyn 3.375g IV q8h (4 hour infusion).  Height: 5\' 7"  (170.2 cm) Weight: 189 lb 13.1 oz (86.1 kg) IBW/kg (Calculated) : 66.1  Temp (24hrs), Avg:98.4 F (36.9 C), Min:98.4 F (36.9 C), Max:98.4 F (36.9 C)   Recent Labs Lab 09/27/16 2221 09/27/16 2232 09/28/16 0023  WBC 13.5*  --   --   CREATININE 0.90  --   --   LATICACIDVEN  --  2.39* 1.77    Estimated Creatinine Clearance: 65.2 mL/min (by C-G formula based on SCr of 0.9 mg/dL).    No Known Allergies  Antimicrobials this admission: Vancomycin 09/28/2016 >> Zosyn 09/28/2016 >>   Dose adjustments this admission: -  Microbiology results: pending  Thank you for allowing pharmacy to be a part of this patient's care.  Nani Skillern Crowford 09/28/2016 4:00 AM

## 2016-09-28 NOTE — Progress Notes (Signed)
Unable to assess orthostatic VS. Pt unable to stand due to weakness

## 2016-09-28 NOTE — Progress Notes (Signed)
Pt admitted after midnight. For details, please refer to admission note done 8/16. Pt admitted for SIRS, thought to be due to sinusitis. Pt on broad spectrum abx.   Leisa Lenz [facility] [phone]

## 2016-09-28 NOTE — Progress Notes (Addendum)
PT with AMS. Alert to self. Neurocheck, otherwise, at baseline. VS WNL. MD updated. MD called back. No new orders. Continue to monitor

## 2016-09-29 DIAGNOSIS — R278 Other lack of coordination: Secondary | ICD-10-CM | POA: Diagnosis not present

## 2016-09-29 DIAGNOSIS — E440 Moderate protein-calorie malnutrition: Secondary | ICD-10-CM | POA: Diagnosis present

## 2016-09-29 DIAGNOSIS — R296 Repeated falls: Secondary | ICD-10-CM | POA: Diagnosis present

## 2016-09-29 DIAGNOSIS — Z7982 Long term (current) use of aspirin: Secondary | ICD-10-CM | POA: Diagnosis not present

## 2016-09-29 DIAGNOSIS — R5381 Other malaise: Secondary | ICD-10-CM | POA: Diagnosis not present

## 2016-09-29 DIAGNOSIS — R7989 Other specified abnormal findings of blood chemistry: Secondary | ICD-10-CM | POA: Diagnosis not present

## 2016-09-29 DIAGNOSIS — R319 Hematuria, unspecified: Secondary | ICD-10-CM | POA: Diagnosis present

## 2016-09-29 DIAGNOSIS — I10 Essential (primary) hypertension: Secondary | ICD-10-CM | POA: Diagnosis not present

## 2016-09-29 DIAGNOSIS — E785 Hyperlipidemia, unspecified: Secondary | ICD-10-CM | POA: Diagnosis present

## 2016-09-29 DIAGNOSIS — R451 Restlessness and agitation: Secondary | ICD-10-CM | POA: Diagnosis not present

## 2016-09-29 DIAGNOSIS — E860 Dehydration: Secondary | ICD-10-CM

## 2016-09-29 DIAGNOSIS — W19XXXA Unspecified fall, initial encounter: Secondary | ICD-10-CM

## 2016-09-29 DIAGNOSIS — Z6829 Body mass index (BMI) 29.0-29.9, adult: Secondary | ICD-10-CM | POA: Diagnosis not present

## 2016-09-29 DIAGNOSIS — Z79899 Other long term (current) drug therapy: Secondary | ICD-10-CM | POA: Diagnosis not present

## 2016-09-29 DIAGNOSIS — J329 Chronic sinusitis, unspecified: Secondary | ICD-10-CM | POA: Diagnosis present

## 2016-09-29 DIAGNOSIS — C61 Malignant neoplasm of prostate: Secondary | ICD-10-CM | POA: Diagnosis present

## 2016-09-29 DIAGNOSIS — J019 Acute sinusitis, unspecified: Secondary | ICD-10-CM | POA: Diagnosis not present

## 2016-09-29 DIAGNOSIS — M6281 Muscle weakness (generalized): Secondary | ICD-10-CM | POA: Diagnosis not present

## 2016-09-29 DIAGNOSIS — I340 Nonrheumatic mitral (valve) insufficiency: Secondary | ICD-10-CM | POA: Diagnosis not present

## 2016-09-29 DIAGNOSIS — M1711 Unilateral primary osteoarthritis, right knee: Secondary | ICD-10-CM | POA: Diagnosis present

## 2016-09-29 DIAGNOSIS — A419 Sepsis, unspecified organism: Secondary | ICD-10-CM | POA: Diagnosis present

## 2016-09-29 DIAGNOSIS — R531 Weakness: Secondary | ICD-10-CM | POA: Diagnosis not present

## 2016-09-29 DIAGNOSIS — I352 Nonrheumatic aortic (valve) stenosis with insufficiency: Secondary | ICD-10-CM | POA: Diagnosis not present

## 2016-09-29 DIAGNOSIS — I361 Nonrheumatic tricuspid (valve) insufficiency: Secondary | ICD-10-CM | POA: Diagnosis not present

## 2016-09-29 DIAGNOSIS — R262 Difficulty in walking, not elsewhere classified: Secondary | ICD-10-CM | POA: Diagnosis not present

## 2016-09-29 DIAGNOSIS — K5900 Constipation, unspecified: Secondary | ICD-10-CM | POA: Diagnosis present

## 2016-09-29 DIAGNOSIS — R140 Abdominal distension (gaseous): Secondary | ICD-10-CM | POA: Diagnosis present

## 2016-09-29 DIAGNOSIS — E876 Hypokalemia: Secondary | ICD-10-CM | POA: Diagnosis not present

## 2016-09-29 LAB — CBC
HCT: 33.8 % — ABNORMAL LOW (ref 39.0–52.0)
HEMOGLOBIN: 11.8 g/dL — AB (ref 13.0–17.0)
MCH: 29.9 pg (ref 26.0–34.0)
MCHC: 34.9 g/dL (ref 30.0–36.0)
MCV: 85.8 fL (ref 78.0–100.0)
PLATELETS: 225 10*3/uL (ref 150–400)
RBC: 3.94 MIL/uL — ABNORMAL LOW (ref 4.22–5.81)
RDW: 14.4 % (ref 11.5–15.5)
WBC: 9.8 10*3/uL (ref 4.0–10.5)

## 2016-09-29 LAB — BASIC METABOLIC PANEL
Anion gap: 6 (ref 5–15)
BUN: 12 mg/dL (ref 6–20)
CHLORIDE: 111 mmol/L (ref 101–111)
CO2: 24 mmol/L (ref 22–32)
CREATININE: 0.72 mg/dL (ref 0.61–1.24)
Calcium: 9.2 mg/dL (ref 8.9–10.3)
GFR calc Af Amer: >60 mL/min (ref >60)
GFR calc non Af Amer: >60 mL/min (ref >60)
GLUCOSE: 128 mg/dL — AB (ref 65–99)
Potassium: 3.7 mmol/L (ref 3.5–5.1)
SODIUM: 141 mmol/L (ref 135–145)

## 2016-09-29 MED ORDER — SODIUM CHLORIDE 0.9 % IV SOLN
INTRAVENOUS | Status: DC
  Administered 2016-09-29: 12:00:00 via INTRAVENOUS

## 2016-09-29 MED ORDER — SODIUM CHLORIDE 0.9 % IV SOLN
INTRAVENOUS | Status: DC
  Filled 2016-09-29 (×2): qty 1000

## 2016-09-29 NOTE — Clinical Social Work Note (Signed)
Clinical Social Work Assessment  Patient Details  Name: Lawrence Douglas MRN: [id number] Date of Birth: 1931-10-26  Date of referral:  09/29/16               Reason for consult:  Facility Placement                Permission sought to share information with:  Chartered certified accountant granted to share information::  Yes, Verbal Permission Granted  Name::     Lawrence Douglas  Agency::  SNF  Relationship::  dtr  Contact Information:     Housing/Transportation Living arrangements for the past 2 months:  [place] of Information:  Patient, Adult Children Patient Interpreter Needed:  None Criminal Activity/Legal Involvement Pertinent to Current Situation/Hospitalization:  No - Comment as needed Significant Relationships:  Adult Children Lives with:  Adult Children (Lawrence Douglas and Lawrence Douglas) Do you feel safe going back to the place where you live?  No Need for family participation in patient care:  Yes (Comment)  Care giving concerns:  Pt lives at home with dtr Lawrence Douglas and son in Neurosurgeon.  Patient is much weaker than baseline and family does not think his needs can be managed at home.   Social Worker assessment / plan:  CSW spoke with pt and pt son at bedside.  CSW explained PT recommendation for SNF- discussed SNF and SNF referral process.  Employment status:  Retired Nurse, adult PT Recommendations:  [place] / Referral to community resources:  [place]  Patient/Family's Response to care:  Pt and son are agreeable to SNF- son states pt dtr, Lawrence Douglas, is primary caregiver and the best person to confirm plan and facility choices with.  Patient/Family's Understanding of and Emotional Response to Diagnosis, Current Treatment, and Prognosis:  Pt seemed somewhat out of it but family has a good grasp on patient needs at this time- hopeful pt will improve after short rehab stay and be safe to return home.  Emotional  Assessment Appearance:  Appears stated age Attitude/Demeanor/Rapport:    Affect (typically observed):  Appropriate, Accepting Orientation:  Oriented to Self, Oriented to Place, Oriented to  Time, Oriented to Situation Alcohol / Substance use:  Not Applicable Psych involvement (Current and /or in the community):  No (Comment)  Discharge Needs  Concerns to be addressed:  Care Coordination Readmission within the last 30 days:  No Current discharge risk:  Physical Impairment Barriers to Discharge:  Continued Medical Work up   Lawrence Ny, LCSW 09/29/2016, 3:48 PM

## 2016-09-29 NOTE — NC FL2 (Signed)
[place] LEVEL OF CARE SCREENING TOOL     IDENTIFICATION  Patient Name: Lawrence Douglas Birthdate: Jan 14, 1932 Sex: male Admission Date (Current Location): 09/27/2016  [facility] and [place] Number:  [employer] and Address:  [facility] [place]. [facility], [place] [address], [place], [place] [address]      Provider Number: [id number]  Attending Physician Name and Address:  Robbie Lis, MD  Relative Name and Phone Number:       Current Level of Care: Hospital Recommended Level of Care: [place] Prior Approval Number:    Date Approved/Denied:   PASRR Number: [id number]  Discharge Plan: SNF    Current Diagnoses: Patient Active Problem List   Diagnosis Date Noted  . Sepsis ([place]) 09/29/2016  . Fall   . Dehydration 09/28/2016  . Essential hypertension 09/28/2016  . Elevated lactic acid level 09/28/2016  . SIRS (systemic inflammatory response syndrome) ([place]) 09/28/2016  . Sinusitis 09/28/2016  . Prostate cancer ([place]) 09/28/2016  . Hematuria 09/28/2016    Orientation RESPIRATION BLADDER Height & Weight     Self, Time, Situation, Place  Normal Incontinent, External catheter Weight: 189 lb 13.1 oz (86.1 kg) Height:  5\' 7"  (170.2 cm)  BEHAVIORAL SYMPTOMS/MOOD NEUROLOGICAL BOWEL NUTRITION STATUS      Continent    AMBULATORY STATUS COMMUNICATION OF NEEDS Skin   Extensive Assist Verbally Normal                       Personal Care Assistance Level of Assistance  Bathing, Dressing Bathing Assistance: Maximum assistance   Dressing Assistance: Maximum assistance     Functional Limitations Info             SPECIAL CARE FACTORS FREQUENCY  PT (By licensed PT), OT (By licensed OT)     PT Frequency: 5/wk OT Frequency: 5/wk            Contractures      Additional Factors Info  Code Status, Allergies Code Status Info: FULL Allergies Info: NKA           Current Medications (09/29/2016):  This is the  current hospital active medication list Current Facility-Administered Medications  Medication Dose Route Frequency Provider Last Rate Last Dose  . 0.9 %  sodium chloride infusion   Intravenous Continuous Robbie Lis, MD 75 mL/hr at 09/29/16 1145    . acetaminophen (TYLENOL) tablet 650 mg  650 mg Oral Q6H PRN Toy Baker, MD   650 mg at 09/28/16 4403   Or  . acetaminophen (TYLENOL) suppository 650 mg  650 mg Rectal Q6H PRN Toy Baker, MD      . amoxicillin-clavulanate (AUGMENTIN) 875-125 MG per tablet 1 tablet  1 tablet Oral Q12H Robbie Lis, MD   1 tablet at 09/29/16 1008  . bicalutamide (CASODEX) tablet 50 mg  50 mg Oral Daily Doutova, Anastassia, MD   50 mg at 09/29/16 1008  . bisacodyl (DULCOLAX) suppository 10 mg  10 mg Rectal Daily PRN Toy Baker, MD      . HYDROcodone-acetaminophen (NORCO/VICODIN) 5-325 MG per tablet 1-2 tablet  1-2 tablet Oral Q4H PRN Toy Baker, MD   1 tablet at 09/28/16 1242  . ondansetron (ZOFRAN) tablet 4 mg  4 mg Oral Q6H PRN Doutova, Anastassia, MD       Or  . ondansetron (ZOFRAN) injection 4 mg  4 mg Intravenous Q6H PRN Doutova, Anastassia, MD      . polyethylene glycol (  MIRALAX / GLYCOLAX) packet 17 g  17 g Oral Daily PRN Doutova, Anastassia, MD      . senna (SENOKOT) tablet 8.6 mg  1 tablet Oral BID Doutova, Anastassia, MD   8.6 mg at 09/29/16 1008  . simvastatin (ZOCOR) tablet 40 mg  40 mg Oral QHS Toy Baker, MD   40 mg at 09/28/16 2134     Discharge Medications: Please see discharge summary for a list of discharge medications.  Relevant Imaging Results:  Relevant Lab Results:   Additional Information SS#: [id number]  [name], LCSW

## 2016-09-29 NOTE — Progress Notes (Addendum)
Patient ID: Lawrence Douglas, male   DOB: October 16, 1931, 83 y.o.   MRN: [id number]  PROGRESS NOTE    Lawrence Douglas  RXV:[id number] DOB: 1931-10-07 DOA: 09/27/2016  PCP: [name], PA-C   Brief Narrative:   83 year old male with history of hypertension, osteoarthritis. Patient presented to ED with fatigue and pain. Patient has fallen multiple times in the last 3 weeks prior to this admission, reporting his legs are giving out. Patient reported symptoms of nasal congestion, sinus pressure and postnasal drip. No fevers. No cough. Patient was found to have sepsis on the admission thought to be secondary to sinusitis. He was started on vancomycin and Zosyn while awaiting culture results. Imaging studies of the hips did not reveal acute fractures.   Assessment & Plan:   Active Problems: Sepsis secondary to sinusitis / Leukocytosis  - Sepsis criteria met on the admission, likely source of infection sinusitis - Patient initially on vancomycin and Zosyn, stopped 09/28/2016 and change to Augmentin - Patient was slightly hypothermic overnight, continue to monitor temperature. May need to change antibiotics if temperature does not improve - Respiratory virus panel is negative - Blood cultures pending - Continue IV fluids   Recurrent falls - Patient apparently had multiple falls over last few weeks prior to this admission - No acute fractures on plain films - He was evaluated by PT and recommendation was for skilled nursing facility placement however his daughter says that patient may not be agreeable to skilled nursing facility placement - Social worker consulted  Dehydration - Will continue IV fluids for hydration, pt not eating as well  Hypokalemia - Due to sepsis - Supplemented and WNL  Prostate cancer (HCC) - Continue Casodex 50 mg daily  Dyslipidemia - Continue Zocor 40 mg at bedtime   DVT prophylaxis: SCDs Code Status: full code  Family Communication: Daughter at  bedside Disposition Plan: Home once patient is medically stable; spoke with patient's daughter at the bedside who says that patient most likely will not want to go to skilled nursing facility which was recommended by PT   Consultants:   PT  Procedures:   None  Antimicrobials:   Vancomycin and Zosyn 09/28/2016  Augmentin 09/28/2016 -->   Subjective: Patient was more agitated and restless at night.  Objective: Vitals:   09/28/16 1333 09/28/16 1600 09/28/16 2037 09/29/16 0454  BP: (!) 154/65 (!) 158/74 (!) 153/77 (!) 141/73  Pulse: 90 (!) 103 98 98  Resp: '18  19 18  '$ Temp: 97.7 F (36.5 C) 98.2 F (36.8 C) 98 F (36.7 C) (!) 97.5 F (36.4 C)  TempSrc: Oral Oral Oral Oral  SpO2: 94% 97% 98% 98%  Weight:      Height:        Intake/Output Summary (Last 24 hours) at 09/29/16 0625 Last data filed at 09/29/16 0455  Gross per 24 hour  Intake              720 ml  Output             1500 ml  Net             -780 ml   Filed Weights   09/27/16 2030 09/28/16 0311  Weight: 86.6 kg (191 lb) 86.1 kg (189 lb 13.1 oz)    Examination:  General exam: Appears calm and comfortable  Respiratory system: Clear to auscultation. Respiratory effort normal. Cardiovascular system: S1 & S2 heard, RRR.  Gastrointestinal system: Abdomen is nondistended, soft and nontender. No organomegaly or  masses felt. Normal bowel sounds heard. Central nervous system: nonfocal, not oriented  Extremities: Symmetric 5 x 5 power. Skin: No rashes, lesions or ulcers Psychiatry: More restless and agitated during the night  Data Reviewed: I have personally reviewed following labs and imaging studies  CBC:  Recent Labs Lab 09/27/16 2221 09/28/16 0418 09/29/16 0410  WBC 13.5* 9.6 9.8  NEUTROABS 10.4*  --   --   HGB 12.2* 11.3* 11.8*  HCT 36.6* 33.7* 33.8*  MCV 88.0 87.5 85.8  PLT 285 239 150   Basic Metabolic Panel:  Recent Labs Lab 09/27/16 2221 09/28/16 0418 09/29/16 0410  NA 141 140 141   K 4.0 3.4* 3.7  CL 110 111 111  CO2 '23 22 24  '$ GLUCOSE 163* 170* 128*  BUN '20 14 12  '$ CREATININE 0.90 0.76 0.72  CALCIUM 9.3 8.5* 9.2  MG  --  1.9  --   PHOS  --  2.7  --    GFR: Estimated Creatinine Clearance: 73.3 mL/min (by C-G formula based on SCr of 0.72 mg/dL). Liver Function Tests:  Recent Labs Lab 09/27/16 2221 09/28/16 0418  AST 22 20  ALT 10* 10*  ALKPHOS 52 47  BILITOT 0.5 1.0  PROT 6.8 6.2*  ALBUMIN 3.7 3.3*   No results for input(s): LIPASE, AMYLASE in the last 168 hours. No results for input(s): AMMONIA in the last 168 hours. Coagulation Profile:  Recent Labs Lab 09/28/16 0442  INR 1.11   Cardiac Enzymes:  Recent Labs Lab 09/28/16 0014  CKTOTAL 114   BNP (last 3 results) No results for input(s): PROBNP in the last 8760 hours. HbA1C:  Recent Labs  09/28/16 0418  HGBA1C 6.1*   CBG: No results for input(s): GLUCAP in the last 168 hours. Lipid Profile: No results for input(s): CHOL, HDL, LDLCALC, TRIG, CHOLHDL, LDLDIRECT in the last 72 hours. Thyroid Function Tests:  Recent Labs  09/28/16 0442  TSH 2.594   Anemia Panel: No results for input(s): VITAMINB12, FOLATE, FERRITIN, TIBC, IRON, RETICCTPCT in the last 72 hours. Urine analysis:    Component Value Date/Time   COLORURINE YELLOW 09/27/2016 [place] 09/27/2016 2310   LABSPEC 1.024 09/27/2016 2310   PHURINE 5.0 09/27/2016 2310   GLUCOSEU NEGATIVE 09/27/2016 2310   HGBUR MODERATE (A) 09/27/2016 2310   BILIRUBINUR NEGATIVE 09/27/2016 2310   KETONESUR 5 (A) 09/27/2016 2310   PROTEINUR 30 (A) 09/27/2016 2310   UROBILINOGEN 0.2 04/11/2007 0159   NITRITE NEGATIVE 09/27/2016 2310   LEUKOCYTESUR NEGATIVE 09/27/2016 2310   Sepsis Labs: '@LABRCNTIP'$ (procalcitonin:4,lacticidven:4)   Recent Results (from the past 240 hour(s))  Respiratory Panel by PCR     Status: None   Collection Time: 09/28/16  4:06 AM  Result Value Ref Range Status   Adenovirus NOT DETECTED NOT  DETECTED Final   Coronavirus 229E NOT DETECTED NOT DETECTED Final   Coronavirus HKU1 NOT DETECTED NOT DETECTED Final   Coronavirus NL63 NOT DETECTED NOT DETECTED Final   Coronavirus OC43 NOT DETECTED NOT DETECTED Final   Metapneumovirus NOT DETECTED NOT DETECTED Final   Rhinovirus / Enterovirus NOT DETECTED NOT DETECTED Final   Influenza A NOT DETECTED NOT DETECTED Final   Influenza A H1 NOT DETECTED NOT DETECTED Final   Influenza A H1 2009 NOT DETECTED NOT DETECTED Final   Influenza A H3 NOT DETECTED NOT DETECTED Final   Influenza B NOT DETECTED NOT DETECTED Final   Parainfluenza Virus 1 NOT DETECTED NOT DETECTED Final   Parainfluenza Virus 2  NOT DETECTED NOT DETECTED Final   Parainfluenza Virus 3 NOT DETECTED NOT DETECTED Final   Parainfluenza Virus 4 NOT DETECTED NOT DETECTED Final   Respiratory Syncytial Virus NOT DETECTED NOT DETECTED Final   Bordetella pertussis NOT DETECTED NOT DETECTED Final   Chlamydophila pneumoniae NOT DETECTED NOT DETECTED Final   Mycoplasma pneumoniae NOT DETECTED NOT DETECTED Final    Comment: Performed at [facility], [place] [address], [place], [place] [address]      Radiology Studies: Dg Chest 2 View  Result Date: 09/27/2016 CLINICAL DATA:  [place] 2 days ago, altered mental status EXAM: CHEST  2 VIEW COMPARISON:  05/01/2007 FINDINGS: Mildly low lung volumes. Linear scar or atelectasis at the medial left lung base. Borderline to mild cardiomegaly. Aortic atherosclerosis. No pneumothorax. Degenerative changes at both shoulders and within the spine. IMPRESSION: No active cardiopulmonary disease. Borderline to mild cardiomegaly. Scarring or atelectasis at the medial left lung base Electronically Signed   By: Donavan Foil M.D.   On: 09/27/2016 22:21   Abd 1 View (kub)  Result Date: 09/28/2016 CLINICAL DATA:  Acute onset of generalized abdominal distention and pain. Initial encounter. EXAM: ABDOMEN - 1 VIEW COMPARISON:  None. FINDINGS: The visualized  bowel gas pattern is unremarkable. Scattered air and stool filled loops of colon are seen; no abnormal dilatation of small bowel loops is seen to suggest small bowel obstruction. No free intra-abdominal air is identified, though evaluation for free air is limited on a single supine view. Degenerative change is noted at the lower lumbar spine; the sacroiliac joints are unremarkable in appearance. The visualized lung bases are essentially clear. IMPRESSION: Unremarkable bowel gas pattern; no free intra-abdominal air seen. Moderate amount of stool noted in the colon. Electronically Signed   By: Garald Balding M.D.   On: 09/28/2016 03:34   Ct Head Wo Contrast  Result Date: 09/27/2016 CLINICAL DATA:  Trip and fall yesterday. EXAM: CT HEAD WITHOUT CONTRAST CT CERVICAL SPINE WITHOUT CONTRAST TECHNIQUE: Multidetector CT imaging of the head and cervical spine was performed following the standard protocol without intravenous contrast. Multiplanar CT image reconstructions of the cervical spine were also generated. COMPARISON:  None. FINDINGS: CT HEAD FINDINGS Brain: No evidence of acute infarction, hemorrhage, hydrocephalus, extra-axial collection or mass lesion/mass effect. Global cerebral and cerebellar atrophy. Mild to moderate chronic small vessel ischemia. Vascular: Atherosclerosis of skullbase vasculature without hyperdense vessel or abnormal calcification. Skull: No skull fracture.  No focal lesion. Sinuses/Orbits: Mucosal thickening in the left frontal sinus, ethmoid air cells, left maxillary sinus and sphenoid sinuses. No fluid levels. Mastoid air cells are clear. Left globe prosthesis. Other: None. CT CERVICAL SPINE FINDINGS Alignment: Straightening of lordosis.  No traumatic subluxation. Skull base and vertebrae: No acute fracture. Vertebral body heights are maintained. The dens and skull base are intact. Soft tissues and spinal canal: No prevertebral fluid or swelling. No visible canal hematoma. Disc levels:  Diffuse disc space narrowing and endplate spurring, most prominent at C4-C5. Degenerative change at C1-C2. Multilevel facet arthropathy. Upper chest: No acute abnormality. Other: Carotid and aortic arch atherosclerosis. IMPRESSION: 1. No acute intracranial abnormality. No skull fracture. Atrophy and chronic small vessel ischemia. 2. Multilevel degenerative change throughout the cervical spine without acute fracture or subluxation. 3. Incidental findings of paranasal sinus inflammation and carotid vascular calcifications. Electronically Signed   By: Jeb Levering M.D.   On: 09/27/2016 22:10   Ct Cervical Spine Wo Contrast  Result Date: 09/27/2016 CLINICAL DATA:  Trip and fall yesterday.  EXAM: CT HEAD WITHOUT CONTRAST CT CERVICAL SPINE WITHOUT CONTRAST TECHNIQUE: Multidetector CT imaging of the head and cervical spine was performed following the standard protocol without intravenous contrast. Multiplanar CT image reconstructions of the cervical spine were also generated. COMPARISON:  None. FINDINGS: CT HEAD FINDINGS Brain: No evidence of acute infarction, hemorrhage, hydrocephalus, extra-axial collection or mass lesion/mass effect. Global cerebral and cerebellar atrophy. Mild to moderate chronic small vessel ischemia. Vascular: Atherosclerosis of skullbase vasculature without hyperdense vessel or abnormal calcification. Skull: No skull fracture.  No focal lesion. Sinuses/Orbits: Mucosal thickening in the left frontal sinus, ethmoid air cells, left maxillary sinus and sphenoid sinuses. No fluid levels. Mastoid air cells are clear. Left globe prosthesis. Other: None. CT CERVICAL SPINE FINDINGS Alignment: Straightening of lordosis.  No traumatic subluxation. Skull base and vertebrae: No acute fracture. Vertebral body heights are maintained. The dens and skull base are intact. Soft tissues and spinal canal: No prevertebral fluid or swelling. No visible canal hematoma. Disc levels: Diffuse disc space narrowing and  endplate spurring, most prominent at C4-C5. Degenerative change at C1-C2. Multilevel facet arthropathy. Upper chest: No acute abnormality. Other: Carotid and aortic arch atherosclerosis. IMPRESSION: 1. No acute intracranial abnormality. No skull fracture. Atrophy and chronic small vessel ischemia. 2. Multilevel degenerative change throughout the cervical spine without acute fracture or subluxation. 3. Incidental findings of paranasal sinus inflammation and carotid vascular calcifications. Electronically Signed   By: Jeb Levering M.D.   On: 09/27/2016 22:10   Dg Knee Complete 4 Views Right  Result Date: 09/27/2016 CLINICAL DATA:  [place] 2 days ago with knee pain EXAM: RIGHT KNEE - COMPLETE 4+ VIEW COMPARISON:  05/06/2003 FINDINGS: Moderate severe arthritis of the right knee involving the patellofemoral, medial and lateral compartments. Prominent bony spurring at the distal femur. Probable joint space calcification laterally. No fracture or malalignment. Small suprapatellar joint effusion. Vascular calcifications. IMPRESSION: 1. Advanced arthritis of the knee. 2. No definite acute osseous abnormality 3. Small suprapatellar joint effusion Electronically Signed   By: Donavan Foil M.D.   On: 09/27/2016 22:26   Dg Humerus Left  Result Date: 09/27/2016 CLINICAL DATA:  Fall with pain EXAM: LEFT HUMERUS - 2+ VIEW COMPARISON:  None. FINDINGS: AC joint and glenohumeral degenerative changes. No fracture or malalignment. Soft tissues are unremarkable. IMPRESSION: No acute osseous abnormality Electronically Signed   By: Donavan Foil M.D.   On: 09/27/2016 22:22   Dg Hip Unilat W Or Wo Pelvis 2-3 Views Right  Result Date: 09/27/2016 CLINICAL DATA:  Fall with hip pain EXAM: DG HIP (WITH OR WITHOUT PELVIS) 2-3V RIGHT COMPARISON:  None. FINDINGS: SI joint arthritis. Pubic symphysis is intact. Pubic rami are within normal limits. Both femoral heads project in joint. No acute displaced fracture or malalignment is seen.  IMPRESSION: Note acute osseous abnormality. Electronically Signed   By: Donavan Foil M.D.   On: 09/27/2016 22:24   Dg Femur Min 2 Views Right  Result Date: 09/27/2016 CLINICAL DATA:  Fall with hip pain EXAM: RIGHT FEMUR 2 VIEWS COMPARISON:  None. FINDINGS: There is no evidence of fracture or other focal bone lesions. Soft tissues are unremarkable. Vascular calcifications IMPRESSION: No acute osseous abnormality Electronically Signed   By: Donavan Foil M.D.   On: 09/27/2016 22:25      Scheduled Meds: . amoxicillin-clavulanate  1 tablet Oral Q12H  . bicalutamide  50 mg Oral Daily  . senna  1 tablet Oral BID  . simvastatin  40 mg Oral QHS   Continuous Infusions:  LOS: 0 days    Time spent: 25 minutes  Greater than 50% of the time spent on counseling and coordinating the care.   Leisa Lenz, MD [facility] Pager [phone]  If 7PM-7AM, please contact night-coverage [email or web address] Password [facility] 09/29/2016, 6:25 AM

## 2016-09-30 DIAGNOSIS — M6281 Muscle weakness (generalized): Secondary | ICD-10-CM | POA: Diagnosis not present

## 2016-09-30 DIAGNOSIS — R531 Weakness: Secondary | ICD-10-CM | POA: Diagnosis not present

## 2016-09-30 DIAGNOSIS — R278 Other lack of coordination: Secondary | ICD-10-CM | POA: Diagnosis not present

## 2016-09-30 DIAGNOSIS — R262 Difficulty in walking, not elsewhere classified: Secondary | ICD-10-CM | POA: Diagnosis not present

## 2016-09-30 DIAGNOSIS — J019 Acute sinusitis, unspecified: Secondary | ICD-10-CM | POA: Diagnosis not present

## 2016-09-30 DIAGNOSIS — R7989 Other specified abnormal findings of blood chemistry: Secondary | ICD-10-CM | POA: Diagnosis not present

## 2016-09-30 DIAGNOSIS — R5381 Other malaise: Secondary | ICD-10-CM | POA: Diagnosis not present

## 2016-09-30 DIAGNOSIS — I10 Essential (primary) hypertension: Secondary | ICD-10-CM | POA: Diagnosis not present

## 2016-09-30 DIAGNOSIS — E860 Dehydration: Secondary | ICD-10-CM | POA: Diagnosis not present

## 2016-09-30 MED ORDER — SENNA 8.6 MG PO TABS: 1.0000 | ORAL_TABLET | Freq: Two times a day (BID) | ORAL | 0 refills | Status: AC

## 2016-09-30 MED ORDER — AMOXICILLIN-POT CLAVULANATE 875-125 MG PO TABS: 1.0000 | ORAL_TABLET | Freq: Two times a day (BID) | ORAL | 0 refills | Status: AC

## 2016-09-30 MED ORDER — ACETAMINOPHEN 325 MG PO TABS: 650.0000 mg | ORAL_TABLET | Freq: Four times a day (QID) | ORAL | 0 refills | Status: AC | PRN

## 2016-09-30 NOTE — Clinical Social Work Placement (Signed)
   CLINICAL SOCIAL WORK PLACEMENT  NOTE  Date:  09/30/2016  Patient Details  Name: Lawrence Douglas MRN: [id number] Date of Birth: January 07, 1932  Clinical Social Work is seeking post-discharge placement for this patient at the [place] level of care (*CSW will initial, date and re-position this form in  chart as items are completed):  Yes   Patient/family provided with [facility] Work Department's list of facilities offering this level of care within the geographic area requested by the patient (or if unable, by the patient's family).  Yes   Patient/family informed of their freedom to choose among providers that offer the needed level of care, that participate in Medicare, Medicaid or managed care program needed by the patient, have an available bed and are willing to accept the patient.  Yes   Patient/family informed of [facility]'s ownership interest in [facility] and [facility], as well as of the fact that they are under no obligation to receive care at these facilities.  PASRR submitted to EDS on 09/29/16     PASRR number received on 09/29/16     Existing PASRR number confirmed on       FL2 transmitted to all facilities in geographic area requested by pt/family on 09/29/16     FL2 transmitted to all facilities within larger geographic area on       Patient informed that his/her managed care company has contracts with or will negotiate with certain facilities, including the following:        Yes   Patient/family informed of bed offers received.  Patient chooses bed at [place] [place]     Physician recommends and patient chooses bed at      Patient to be transferred to [place] [place] on 09/30/16.  Patient to be transferred to facility by ptar     Patient family notified on 09/30/16 of transfer.  Name of family member notified:  [name]     PHYSICIAN Please sign FL2     Additional Comment:     _______________________________________________ [name], LCSW 09/30/2016, 11:24 AM

## 2016-09-30 NOTE — Discharge Summary (Addendum)
Physician Discharge Summary  Lawrence Douglas OEU:[id number] DOB: [birth year] DOA: 09/27/2016  PCP: Lawrence Jaeger, PA-C  Admit date: 09/27/2016 Discharge date: 09/30/2016  Recommendations for Outpatient Follow-up:  Continue Augmentin for 7 days on discharge for sinusitis.  Discharge Diagnoses:  Active Problems:   Dehydration   Essential hypertension   Elevated lactic acid level   SIRS (systemic inflammatory response syndrome) (HCC)   Sinusitis   Prostate cancer (HCC)   Hematuria   Sepsis ([place])   Fall    Discharge Condition: stable   Diet recommendation: as tolerated   History of present illness:  83 year old male with history of hypertension, osteoarthritis. Patient presented to ED with fatigue and pain. Patient has fallen multiple times in the last 3 weeks prior to this admission, reporting his legs are giving out. Patient reported symptoms of nasal congestion, sinus pressure and postnasal drip. No fevers. No cough. Patient was found to have sepsis on the admission thought to be secondary to sinusitis. He was started on vancomycin and Zosyn while awaiting culture results. Imaging studies of the hips did not reveal acute fractures.   Hospital Course:  Active Problems: Sepsis secondary to sinusitis / Leukocytosis  - Sepsis criteria met on the admission, likely source of infection sinusitis - Patient initially on vancomycin and Zosyn, stopped 09/28/2016 - Started Augmentin 8/16 and pt will continue for 7 days on discharge  - Resp virus panel negative  - Blood cx negative   Recurrent falls - Patient apparently had multiple falls over last few weeks prior to this admission - No acute fractures on plain films - He was evaluated by PT and recommendation was for skilled nursing facility placement  - I spoke with pt daughter daily and again this am and she is agreeable to SNF placement   Dehydration - IV fluids given in hospital   Hypokalemia - Due to sepsis -  Supplemented   Prostate cancer ([place]) - Continue Casodex 50 mg daily  Dyslipidemia - Continue Zocor 40 mg at bedtime  Moderate protein calorie malnutrition - In the context of chronic illness  - Seen by nutritionist    DVT prophylaxis: SCD's Code Status: full code  Family Communication: daughter at bedside    Consultants:   PT  Procedures:   None  Antimicrobials:   Vancomycin and Zosyn 09/28/2016  Augmentin 09/28/2016 --> continue for 7 days on discharge     Signed:  Leisa Lenz, MD  [facility] 09/30/2016, 9:22 AM  Pager #: [phone]  Time spent in minutes: more than 30 minutes   Discharge Exam: Vitals:   09/29/16 2128 09/30/16 0508  BP: 136/84 (!) 167/82  Pulse: (!) 107 (!) 109  Resp: 18 18  Temp: 98.2 F (36.8 C) 98.6 F (37 C)  SpO2: 98% 98%   Vitals:   09/29/16 0454 09/29/16 1431 09/29/16 2128 09/30/16 0508  BP: (!) 141/73 (!) 156/67 136/84 (!) 167/82  Pulse: 98 (!) 104 (!) 107 (!) 109  Resp: '18 18 18 18  '$ Temp: (!) 97.5 F (36.4 C) 98.3 F (36.8 C) 98.2 F (36.8 C) 98.6 F (37 C)  TempSrc: Oral Oral Oral Oral  SpO2: 98% 98% 98% 98%  Weight:      Height:        General: Pt is alert, disoriented, not in acute distress  Cardiovascular: Regular rate and rhythm, S1/S2 + Respiratory: Clear to auscultation bilaterally, no wheezing, no crackles, no rhonchi Abdominal: Soft, non tender, non distended, bowel sounds +, no guarding Extremities: no  edema, no cyanosis, pulses palpable bilaterally DP and PT Neuro: disoriented otherwise non focal  Discharge Instructions  Discharge Instructions    Call MD for:  persistant nausea and vomiting    Complete by:  As directed    Call MD for:  severe uncontrolled pain    Complete by:  As directed    Diet - low sodium heart healthy    Complete by:  As directed    Discharge instructions    Complete by:  As directed    Continue Augmentin for 7 days on discharge for sinusitis.    Increase activity slowly    Complete by:  As directed      Allergies as of 09/30/2016   No Known Allergies     Medication List    TAKE these medications   acetaminophen 325 MG tablet Commonly known as:  TYLENOL Take 2 tablets (650 mg total) by mouth every 6 (six) hours as needed for mild pain (or Fever >/= 101).   amLODipine 10 MG tablet Commonly known as:  NORVASC Take 10 mg by mouth daily.   amoxicillin-clavulanate 875-125 MG tablet Commonly known as:  AUGMENTIN Take 1 tablet by mouth every 12 (twelve) hours.   bicalutamide 50 MG tablet Commonly known as:  CASODEX TAKE 1 TAB DAILY FOR PROSTATE CANCER   CALCIUM CARBONATE PO Take 2 tablets by mouth daily.   enalapril 20 MG tablet Commonly known as:  VASOTEC Take 20 mg by mouth daily.   Fish Oil 600 MG Caps Take 2 capsules by mouth daily.   senna 8.6 MG Tabs tablet Commonly known as:  SENOKOT Take 1 tablet (8.6 mg total) by mouth 2 (two) times daily.   simvastatin 40 MG tablet Commonly known as:  ZOCOR Take 40 mg by mouth at bedtime.      Follow-up Information    [place], [place], [place]. Schedule an appointment as soon as possible for a visit.   Specialty:  Physician Assistant Contact information: [place] [address] [phone]            The results of significant diagnostics from this hospitalization (including imaging, microbiology, ancillary and laboratory) are listed below for reference.    Significant Diagnostic Studies: Dg Chest 2 View  Result Date: 09/27/2016 CLINICAL DATA:  [place] 2 days ago, altered mental status EXAM: CHEST  2 VIEW COMPARISON:  05/01/2007 FINDINGS: Mildly low lung volumes. Linear scar or atelectasis at the medial left lung base. Borderline to mild cardiomegaly. Aortic atherosclerosis. No pneumothorax. Degenerative changes at both shoulders and within the spine. IMPRESSION: No active cardiopulmonary disease. Borderline to mild cardiomegaly. Scarring or  atelectasis at the medial left lung base Electronically Signed   By: Donavan Foil M.D.   On: 09/27/2016 22:21   Abd 1 View (kub)  Result Date: 09/28/2016 CLINICAL DATA:  Acute onset of generalized abdominal distention and pain. Initial encounter. EXAM: ABDOMEN - 1 VIEW COMPARISON:  None. FINDINGS: The visualized bowel gas pattern is unremarkable. Scattered air and stool filled loops of colon are seen; no abnormal dilatation of small bowel loops is seen to suggest small bowel obstruction. No free intra-abdominal air is identified, though evaluation for free air is limited on a single supine view. Degenerative change is noted at the lower lumbar spine; the sacroiliac joints are unremarkable in appearance. The visualized lung bases are essentially clear. IMPRESSION: Unremarkable bowel gas pattern; no free intra-abdominal air seen. Moderate amount of stool noted in the colon. Electronically Signed  By: Garald Balding M.D.   On: 09/28/2016 03:34   Ct Head Wo Contrast  Result Date: 09/27/2016 CLINICAL DATA:  Trip and fall yesterday. EXAM: CT HEAD WITHOUT CONTRAST CT CERVICAL SPINE WITHOUT CONTRAST TECHNIQUE: Multidetector CT imaging of the head and cervical spine was performed following the standard protocol without intravenous contrast. Multiplanar CT image reconstructions of the cervical spine were also generated. COMPARISON:  None. FINDINGS: CT HEAD FINDINGS Brain: No evidence of acute infarction, hemorrhage, hydrocephalus, extra-axial collection or mass lesion/mass effect. Global cerebral and cerebellar atrophy. Mild to moderate chronic small vessel ischemia. Vascular: Atherosclerosis of skullbase vasculature without hyperdense vessel or abnormal calcification. Skull: No skull fracture.  No focal lesion. Sinuses/Orbits: Mucosal thickening in the left frontal sinus, ethmoid air cells, left maxillary sinus and sphenoid sinuses. No fluid levels. Mastoid air cells are clear. Left globe prosthesis. Other: None.  CT CERVICAL SPINE FINDINGS Alignment: Straightening of lordosis.  No traumatic subluxation. Skull base and vertebrae: No acute fracture. Vertebral body heights are maintained. The dens and skull base are intact. Soft tissues and spinal canal: No prevertebral fluid or swelling. No visible canal hematoma. Disc levels: Diffuse disc space narrowing and endplate spurring, most prominent at C4-C5. Degenerative change at C1-C2. Multilevel facet arthropathy. Upper chest: No acute abnormality. Other: Carotid and aortic arch atherosclerosis. IMPRESSION: 1. No acute intracranial abnormality. No skull fracture. Atrophy and chronic small vessel ischemia. 2. Multilevel degenerative change throughout the cervical spine without acute fracture or subluxation. 3. Incidental findings of paranasal sinus inflammation and carotid vascular calcifications. Electronically Signed   By: Jeb Levering M.D.   On: 09/27/2016 22:10   Ct Cervical Spine Wo Contrast  Result Date: 09/27/2016 CLINICAL DATA:  Trip and fall yesterday. EXAM: CT HEAD WITHOUT CONTRAST CT CERVICAL SPINE WITHOUT CONTRAST TECHNIQUE: Multidetector CT imaging of the head and cervical spine was performed following the standard protocol without intravenous contrast. Multiplanar CT image reconstructions of the cervical spine were also generated. COMPARISON:  None. FINDINGS: CT HEAD FINDINGS Brain: No evidence of acute infarction, hemorrhage, hydrocephalus, extra-axial collection or mass lesion/mass effect. Global cerebral and cerebellar atrophy. Mild to moderate chronic small vessel ischemia. Vascular: Atherosclerosis of skullbase vasculature without hyperdense vessel or abnormal calcification. Skull: No skull fracture.  No focal lesion. Sinuses/Orbits: Mucosal thickening in the left frontal sinus, ethmoid air cells, left maxillary sinus and sphenoid sinuses. No fluid levels. Mastoid air cells are clear. Left globe prosthesis. Other: None. CT CERVICAL SPINE FINDINGS  Alignment: Straightening of lordosis.  No traumatic subluxation. Skull base and vertebrae: No acute fracture. Vertebral body heights are maintained. The dens and skull base are intact. Soft tissues and spinal canal: No prevertebral fluid or swelling. No visible canal hematoma. Disc levels: Diffuse disc space narrowing and endplate spurring, most prominent at C4-C5. Degenerative change at C1-C2. Multilevel facet arthropathy. Upper chest: No acute abnormality. Other: Carotid and aortic arch atherosclerosis. IMPRESSION: 1. No acute intracranial abnormality. No skull fracture. Atrophy and chronic small vessel ischemia. 2. Multilevel degenerative change throughout the cervical spine without acute fracture or subluxation. 3. Incidental findings of paranasal sinus inflammation and carotid vascular calcifications. Electronically Signed   By: Jeb Levering M.D.   On: 09/27/2016 22:10   Dg Knee Complete 4 Views Right  Result Date: 09/27/2016 CLINICAL DATA:  [place] 2 days ago with knee pain EXAM: RIGHT KNEE - COMPLETE 4+ VIEW COMPARISON:  05/06/2003 FINDINGS: Moderate severe arthritis of the right knee involving the patellofemoral, medial and lateral compartments. Prominent bony spurring at  the distal femur. Probable joint space calcification laterally. No fracture or malalignment. Small suprapatellar joint effusion. Vascular calcifications. IMPRESSION: 1. Advanced arthritis of the knee. 2. No definite acute osseous abnormality 3. Small suprapatellar joint effusion Electronically Signed   By: Donavan Foil M.D.   On: 09/27/2016 22:26   Dg Humerus Left  Result Date: 09/27/2016 CLINICAL DATA:  Fall with pain EXAM: LEFT HUMERUS - 2+ VIEW COMPARISON:  None. FINDINGS: AC joint and glenohumeral degenerative changes. No fracture or malalignment. Soft tissues are unremarkable. IMPRESSION: No acute osseous abnormality Electronically Signed   By: Donavan Foil M.D.   On: 09/27/2016 22:22   Dg Hip Unilat W Or Wo Pelvis 2-3  Views Right  Result Date: 09/27/2016 CLINICAL DATA:  Fall with hip pain EXAM: DG HIP (WITH OR WITHOUT PELVIS) 2-3V RIGHT COMPARISON:  None. FINDINGS: SI joint arthritis. Pubic symphysis is intact. Pubic rami are within normal limits. Both femoral heads project in joint. No acute displaced fracture or malalignment is seen. IMPRESSION: Note acute osseous abnormality. Electronically Signed   By: Donavan Foil M.D.   On: 09/27/2016 22:24   Dg Femur Min 2 Views Right  Result Date: 09/27/2016 CLINICAL DATA:  Fall with hip pain EXAM: RIGHT FEMUR 2 VIEWS COMPARISON:  None. FINDINGS: There is no evidence of fracture or other focal bone lesions. Soft tissues are unremarkable. Vascular calcifications IMPRESSION: No acute osseous abnormality Electronically Signed   By: Donavan Foil M.D.   On: 09/27/2016 22:25    Microbiology: Recent Results (from the past 240 hour(s))  Blood culture (routine x 2)     Status: None (Preliminary result)   Collection Time: 09/28/16 12:19 AM  Result Value Ref Range Status   Specimen Description BLOOD RIGHT ANTECUBITAL  Final   Special Requests   Final    BOTTLES DRAWN AEROBIC AND ANAEROBIC Blood Culture results may not be optimal due to an inadequate volume of blood received in culture bottles   Culture   Final    NO GROWTH 1 DAY Performed at [facility], [place] [address], [place], [place] [address]    Report Status PENDING  Incomplete  Blood culture (routine x 2)     Status: None (Preliminary result)   Collection Time: 09/28/16  1:00 AM  Result Value Ref Range Status   Specimen Description BLOOD LEFT ANTECUBITAL  Final   Special Requests   Final    BOTTLES DRAWN AEROBIC AND ANAEROBIC Blood Culture adequate volume   Culture   Final    NO GROWTH 1 DAY Performed at [facility], [place] [address] [place], [place] [address]    Report Status PENDING  Incomplete  Respiratory Panel by PCR     Status: None   Collection Time: 09/28/16  4:06 AM  Result Value Ref  Range Status   Adenovirus NOT DETECTED NOT DETECTED Final   Coronavirus 229E NOT DETECTED NOT DETECTED Final   Coronavirus HKU1 NOT DETECTED NOT DETECTED Final   Coronavirus NL63 NOT DETECTED NOT DETECTED Final   Coronavirus OC43 NOT DETECTED NOT DETECTED Final   Metapneumovirus NOT DETECTED NOT DETECTED Final   Rhinovirus / Enterovirus NOT DETECTED NOT DETECTED Final   Influenza A NOT DETECTED NOT DETECTED Final   Influenza A H1 NOT DETECTED NOT DETECTED Final   Influenza A H1 2009 NOT DETECTED NOT DETECTED Final   Influenza A H3 NOT DETECTED NOT DETECTED Final   Influenza B NOT DETECTED NOT DETECTED Final   Parainfluenza Virus  1 NOT DETECTED NOT DETECTED Final   Parainfluenza Virus 2 NOT DETECTED NOT DETECTED Final   Parainfluenza Virus 3 NOT DETECTED NOT DETECTED Final   Parainfluenza Virus 4 NOT DETECTED NOT DETECTED Final   Respiratory Syncytial Virus NOT DETECTED NOT DETECTED Final   Bordetella pertussis NOT DETECTED NOT DETECTED Final   Chlamydophila pneumoniae NOT DETECTED NOT DETECTED Final   Mycoplasma pneumoniae NOT DETECTED NOT DETECTED Final    Comment: Performed at [facility], [place] [address], [place], [place] [address]     Labs: Basic Metabolic Panel:  Recent Labs Lab 09/27/16 2221 09/28/16 0418 09/29/16 0410  NA 141 140 141  K 4.0 3.4* 3.7  CL 110 111 111  CO2 '23 22 24  '$ GLUCOSE 163* 170* 128*  BUN '20 14 12  '$ CREATININE 0.90 0.76 0.72  CALCIUM 9.3 8.5* 9.2  MG  --  1.9  --   PHOS  --  2.7  --    Liver Function Tests:  Recent Labs Lab 09/27/16 2221 09/28/16 0418  AST 22 20  ALT 10* 10*  ALKPHOS 52 47  BILITOT 0.5 1.0  PROT 6.8 6.2*  ALBUMIN 3.7 3.3*   No results for input(s): LIPASE, AMYLASE in the last 168 hours. No results for input(s): AMMONIA in the last 168 hours. CBC:  Recent Labs Lab 09/27/16 2221 09/28/16 0418 09/29/16 0410  WBC 13.5* 9.6 9.8  NEUTROABS 10.4*  --   --   HGB 12.2* 11.3* 11.8*  HCT 36.6* 33.7* 33.8*  MCV  88.0 87.5 85.8  PLT 285 239 225   Cardiac Enzymes:  Recent Labs Lab 09/28/16 0014  CKTOTAL 114   BNP: BNP (last 3 results) No results for input(s): BNP in the last 8760 hours.  ProBNP (last 3 results) No results for input(s): PROBNP in the last 8760 hours.  CBG: No results for input(s): GLUCAP in the last 168 hours.

## 2016-09-30 NOTE — Discharge Instructions (Signed)

## 2016-09-30 NOTE — Progress Notes (Signed)
Patient will discharge to [facility] Anticipated discharge date: 8/18 Family notified: dtr [name] at bedside Transportation by [facility]- scheduled for 1:30pm  CSW signing off.  [name], LCSW Clinical Social Worker [phone]

## 2016-10-03 LAB — CULTURE, BLOOD (ROUTINE X 2)
CULTURE: NO GROWTH
CULTURE: NO GROWTH
SPECIAL REQUESTS: ADEQUATE

## 2016-10-23 ENCOUNTER — Other Ambulatory Visit: Payer: Self-pay | Admitting: *Deleted

## 2016-10-24 DIAGNOSIS — A419 Sepsis, unspecified organism: Secondary | ICD-10-CM | POA: Diagnosis not present

## 2016-10-24 DIAGNOSIS — J329 Chronic sinusitis, unspecified: Secondary | ICD-10-CM | POA: Diagnosis not present

## 2016-10-24 DIAGNOSIS — Z9189 Other specified personal risk factors, not elsewhere classified: Secondary | ICD-10-CM | POA: Diagnosis not present

## 2016-10-24 DIAGNOSIS — W19XXXA Unspecified fall, initial encounter: Secondary | ICD-10-CM | POA: Diagnosis not present

## 2016-10-24 DIAGNOSIS — C61 Malignant neoplasm of prostate: Secondary | ICD-10-CM | POA: Diagnosis not present

## 2016-10-24 NOTE — Patient Outreach (Addendum)
[facility]) Care Management  10/24/2016  Lawrence Douglas 05-11-1931 [id number]  Transition of Care Referral  Referral Date: 10/20/16 Referral Source: [facility] Date of Discharge: 10/22/16 Facility: N/A Discharge Diagnosis: Weakness/Falls Insurance: [facility] Medicare  Outreach attempt # 1 to patient and HIPAA verified. Patient requested for RN CM [place]) to speak with his daughter Lawrence Douglas). Lawrence Douglas lives with the patient. Lawrence Douglas is the patient's primary caregiver. Patient is active with [facility] (PT/RN). He ambulates with a RW and uses a wheel chair. Lawrence Douglas is requesting another wheelchair for the patient. She stated, his current wheelchair need repairs.   Patient was discharged from the rehab on 09/30/16 due to weakness. Patient was diagnosed with SIRS/Sinusitis while inpatient at [facility]. Patient was having multiple falls at home. He sustained multiple bruises and re-injured his knee. Lawrence Douglas reported, patient is dependent/assist with his ADLs. Lawrence Douglas stated, patient is incontinent with bowel/bladders, since discharging from rehab. She is requesting assistance with patient's care. Lawrence Douglas reported, she is dependent upon oxygen and has multiple comorbidities.   Patient has a follow-up discharge appointment on 10/24/16 at [facility] with Dr. Lorin Mercy [phone]). His annual wellness appointment is 12/22/16 with Dr. Artist Beach.     Plan: RN CM will send referral to [facility] RN for further in home eval/assessment of care needs and management of chronic conditions. RN CM advised patient that [facility] community RN CM would follow up and contact patient within the next 10 days. RN CM will send [facility] SW referral for possible assistance with community resources related to transportation.  Lake Bells, RN, BSN, MHA/MSL, [place] Telephonic Care Manager Coordinator [facility] Direct Phone: [phone] Toll Free:  [phone] Fax: [phone]

## 2016-10-25 ENCOUNTER — Other Ambulatory Visit: Payer: Self-pay

## 2016-10-25 DIAGNOSIS — W19XXXD Unspecified fall, subsequent encounter: Secondary | ICD-10-CM | POA: Diagnosis not present

## 2016-10-25 DIAGNOSIS — C61 Malignant neoplasm of prostate: Secondary | ICD-10-CM | POA: Diagnosis not present

## 2016-10-25 DIAGNOSIS — I69354 Hemiplegia and hemiparesis following cerebral infarction affecting left non-dominant side: Secondary | ICD-10-CM | POA: Diagnosis not present

## 2016-10-25 DIAGNOSIS — M810 Age-related osteoporosis without current pathological fracture: Secondary | ICD-10-CM | POA: Diagnosis not present

## 2016-10-25 DIAGNOSIS — Z87891 Personal history of nicotine dependence: Secondary | ICD-10-CM | POA: Diagnosis not present

## 2016-10-25 DIAGNOSIS — Z9181 History of falling: Secondary | ICD-10-CM | POA: Diagnosis not present

## 2016-10-25 DIAGNOSIS — I10 Essential (primary) hypertension: Secondary | ICD-10-CM | POA: Diagnosis not present

## 2016-10-25 DIAGNOSIS — E785 Hyperlipidemia, unspecified: Secondary | ICD-10-CM | POA: Diagnosis not present

## 2016-10-25 DIAGNOSIS — R296 Repeated falls: Secondary | ICD-10-CM | POA: Diagnosis not present

## 2016-10-25 NOTE — Patient Outreach (Signed)
Care Coordination: Placed call to patient and spoke with daughter [name]. Reviewed reason for call.  Offered an appointment to come do a home assessment to determine patient needs.  Daughter agreed.  PLAN: Initial home visit scheduled for 10/31/2016 at 10 am. Confirmed address and provided my contact information.  Tomasa Rand, RN, BSN, CEN [facility] [employer] [phone]

## 2016-10-26 DIAGNOSIS — C61 Malignant neoplasm of prostate: Secondary | ICD-10-CM | POA: Diagnosis not present

## 2016-10-26 DIAGNOSIS — I10 Essential (primary) hypertension: Secondary | ICD-10-CM | POA: Diagnosis not present

## 2016-10-26 DIAGNOSIS — M810 Age-related osteoporosis without current pathological fracture: Secondary | ICD-10-CM | POA: Diagnosis not present

## 2016-10-26 DIAGNOSIS — E785 Hyperlipidemia, unspecified: Secondary | ICD-10-CM | POA: Diagnosis not present

## 2016-10-26 DIAGNOSIS — M6281 Muscle weakness (generalized): Secondary | ICD-10-CM | POA: Diagnosis not present

## 2016-10-26 DIAGNOSIS — R410 Disorientation, unspecified: Secondary | ICD-10-CM | POA: Diagnosis not present

## 2016-10-27 ENCOUNTER — Encounter: Payer: Self-pay | Admitting: *Deleted

## 2016-10-27 ENCOUNTER — Other Ambulatory Visit: Payer: Self-pay | Admitting: *Deleted

## 2016-10-27 NOTE — Patient Outreach (Signed)
Request received from Joanna Saporito, LCSW to mail patient personal care resources.  Information mailed today. 

## 2016-10-27 NOTE — Patient Outreach (Signed)
[facility]) Care Management  10/27/2016  Lawrence Douglas December 15, 1931 [id number]   CSW was able to make initial contact with patient today to perform phone assessment, as well as assess and assist with social work needs and services.  CSW introduced self, explained role and types of services provided through [place] [facility] ([place] [facility]).  CSW further explained to patient that CSW works with patient's Telephonic RNCM, also with [place] [facility], Juanita Futrell. CSW then explained the reason for the call, indicating that Mrs. Futrell thought that patient would benefit from social work services and resources to assist with referrals for various community agencies and resources, as well as obtaining additional home care services for patient and daughter, Lockie Mola.  CSW obtained two HIPAA compliant identifiers from patient, which included patient's name and date of birth. Patient and Ms. Janalyn Harder both admit that they could use some extra help in the home with activities of daily living, such as bathing, dressing, meal preparation, light-housekeeping duties, etc.  CSW agreed to mail patient a list of Medical sales representative, explaining that [place] colleague, Eduard Clos will be following up within the next two weeks to ensure that patient received the packet of resources mailed to his home, as well as answer any questions patient may have at that time.  Patient and Ms. Janalyn Harder voiced understanding and were agreeable to this plan. Nat Christen, BSW, MSW, LCSW  Licensed Education officer, [facility]  Mailing [place] [address], [place], [place] [address] Physical Address-[address], [place], [place] [address] Toll Free Main # [phone] Fax # [phone] Cell # [phone]  Office # [phone] [email or web address]

## 2016-10-31 ENCOUNTER — Other Ambulatory Visit: Payer: Self-pay

## 2016-10-31 DIAGNOSIS — M6281 Muscle weakness (generalized): Secondary | ICD-10-CM | POA: Diagnosis not present

## 2016-10-31 DIAGNOSIS — M810 Age-related osteoporosis without current pathological fracture: Secondary | ICD-10-CM | POA: Diagnosis not present

## 2016-10-31 DIAGNOSIS — E785 Hyperlipidemia, unspecified: Secondary | ICD-10-CM | POA: Diagnosis not present

## 2016-10-31 DIAGNOSIS — I10 Essential (primary) hypertension: Secondary | ICD-10-CM | POA: Diagnosis not present

## 2016-10-31 DIAGNOSIS — C61 Malignant neoplasm of prostate: Secondary | ICD-10-CM | POA: Diagnosis not present

## 2016-10-31 DIAGNOSIS — R410 Disorientation, unspecified: Secondary | ICD-10-CM | POA: Diagnosis not present

## 2016-10-31 NOTE — Patient Outreach (Signed)
[facility]) Care Management   10/31/2016  [name] April 14, 1931 [id number] [facility] student [employer] [facility] attended home visit.  Daughter [name] present for home visit.  [name] is an 83 y.o. male  Subjective:  Discharged from [facility] on 10/25/2016.  Active with [facility] for PT.  Daughter reports that patient needs a new wheelchair.  Daughter states that home health is getting order for wheelchair.  Patient and daughter states that patient has had multiple falls in the last 3 months.  Mostly coming out of the bathroom. Daughter is currently the caretaker for patient and assist with sponge bath.  Patient is only able to feed himself and needs assistance with all other activities. Daughter reports not much progress during rehab.  Prior to last hospitalization patient was more ambulatory than he is currently.  Objective:   Arrived to find patient sitting in wheelchair at Douglas table eating unassisted.  Awake and alert. Smells of urine. Home is cluttered. Throw rugs noted throughout home.  Ramp at back door without handrails and appears unstable.  Bathroom small, one hand rail noted.  Patient appears happy and content.  Vitals:   10/31/16 1037 10/31/16 1039  BP:  132/64  Pulse:  99  Resp:  18  SpO2:  98%  Weight: 203 lb (92.1 kg)   Height: 1.676 m (5\' 6" )    Review of Systems  HENT: Negative.   Eyes:       Wears glasses  Respiratory: Positive for cough.   Cardiovascular: Negative.   Gastrointestinal: Positive for constipation.  Genitourinary: Positive for frequency.  Musculoskeletal: Positive for joint pain.       Knee pain  Skin: Negative.   Neurological: Positive for dizziness and weakness.       Occasionally dizziness when changing positions.   Endo/Heme/Allergies: Negative.   Psychiatric/Behavioral: Positive for memory loss.    Physical Exam  Constitutional: He is oriented to person, place, and time. He appears well-developed and  well-nourished.  Cardiovascular: Normal rate, regular rhythm, normal heart sounds and intact distal pulses.   Respiratory: Effort normal and breath sounds normal.  GI: Soft. Bowel sounds are normal.  Musculoskeletal: Normal range of motion.  Able to raise hands above head, able to wiggle toes, able to kick legs  Neurological: He is alert and oriented to person, place, and time.  [place], September, 2018, Tuesday.   Skin: Skin is warm and dry.  No lesions or sores on feet. Skin without problems on back.   Psychiatric: He has a normal mood and affect. His behavior is normal. Judgment and thought content normal.    Encounter Medications:   Outpatient Encounter Prescriptions as of 10/31/2016  Medication Sig  . acetaminophen (TYLENOL) 325 MG tablet Take 2 tablets (650 mg total) by mouth every 6 (six) hours as needed for mild pain (or Fever >/= 101).  [facility] amLODipine (NORVASC) 10 MG tablet Take 10 mg by mouth daily.  [facility] CALCIUM CARBONATE PO Take 2 tablets by mouth daily.   . enalapril (VASOTEC) 20 MG tablet Take 20 mg by mouth 2 (two) times daily.   . enzalutamide (XTANDI) 40 MG capsule Take 160 mg by mouth daily.  . simvastatin (ZOCOR) 40 MG tablet Take 40 mg by mouth at bedtime.  . bicalutamide (CASODEX) 50 MG tablet TAKE 1 TAB DAILY FOR PROSTATE CANCER  . Omega-3 Fatty Acids (FISH OIL) 600 MG CAPS Take 2 capsules by mouth daily.  [facility] senna (SENOKOT) 8.6 MG TABS  tablet Take 1 tablet (8.6 mg total) by mouth 2 (two) times daily. (Patient not taking: Reported on 10/31/2016)   No facility-administered encounter medications on file as of 10/31/2016.     Functional Status:   In your present state of health, do you have any difficulty performing the following activities: 10/31/2016 09/28/2016  Hearing? Y N  Vision? N Y  Difficulty concentrating or making decisions? Y N  Walking or climbing stairs? Y Y  Dressing or bathing? Y N  Doing errands, shopping? Tempie Douglas  Preparing Food and eating ? Y -  Using the  Toilet? Y -  In the past six months, have you accidently leaked urine? Y -  Do you have problems with loss of bowel control? N -  Managing your Medications? Y -  Comment daughter manages medications -  Managing your Finances? N -  Housekeeping or managing your Housekeeping? N -  Some recent data might be hidden    Fall/Depression Screening:    Fall Risk  10/31/2016  Falls in the past year? Yes  Number falls in past yr: 2 or more  Injury with Fall? Yes  Comment left knee and left shoulder  Risk Factor Category  High Fall Risk  Risk for fall due to : History of fall(s)  Follow up Falls prevention discussed   PHQ 2/9 Scores 10/31/2016  PHQ - 2 Score 0    Assessment:   (1) reviewed [facility] program. Provided new patient packet, [facility] calendar, my contact card and [facility] magnet. Reviewed consent and consent form completed.  Reviewed transition of care program. (2) fall risk (3) no advanced directives. (4) need a new wheelchair. (5) daughter interested in caregiver services to aid in personal care and when she needs to leave the home. (6) needs new ramp or ramp repair.   Plan:  (1) consent scanned into chart. Patient will be outreached weekly for transition of care. Patient request I speak with daughter, [name]. (2) reviewed fall precautions to include standing to gain balance prior to walking, proper use of a walker, tripping hazard removal of throw rugs.  (3) provided Advance directive packet and reviewed how to complete. (4) PT with [facility] home health to get RX for new wheelchair. Will continue to follow up. (5) Referred to [facility] social worker. (6) will update [facility] social worker about needs for assistance with ramp.  Care planning and goal setting during home visit and primary goal to avoid falls.  Next call in 1 week. Barrier letter and this note sent to MD.  [facility] CM Care Plan Problem One     Most Recent Value  Care Plan Problem One  Recent admission to the hospital for weakness  and falls.  Role Documenting the Problem One  Care Management [place] for Problem One  Active  [facility] Long Term Goal   Patient will report no readmissions to the hospital in the next 60 days.  [facility] Long Term Goal Start Date  10/31/16  Interventions for Problem One Long Term Goal  reviewed [facility] program. Reviewed discharge instructions and medications.  Home visit completed.   [facility] CM Short Term Goal #1   Patient will report no falls in the next 30 days.   [facility] CM Short Term Goal #1 Start Date  10/31/16  Interventions for Short Term Goal #1  Reviewed fall preacautions, Reviewed safety concerns with throw rugs. Encouraged patient to use walker carefully and correctly. Correct technique demonstrated.   [facility] CM Short Term  Goal #2   Patient will report receiving a new wheelchair in the next 30 days.  [facility] CM Short Term Goal #2 Start Date  10/31/16  Interventions for Short Term Goal #2  Reviewed with patient the importance of working well with PT to aid in wheelchair order.      Tomasa Rand, RN, BSN, CEN [facility] [employer] [phone]

## 2016-11-01 DIAGNOSIS — E785 Hyperlipidemia, unspecified: Secondary | ICD-10-CM | POA: Diagnosis not present

## 2016-11-01 DIAGNOSIS — C61 Malignant neoplasm of prostate: Secondary | ICD-10-CM | POA: Diagnosis not present

## 2016-11-01 DIAGNOSIS — M6281 Muscle weakness (generalized): Secondary | ICD-10-CM | POA: Diagnosis not present

## 2016-11-01 DIAGNOSIS — R410 Disorientation, unspecified: Secondary | ICD-10-CM | POA: Diagnosis not present

## 2016-11-01 DIAGNOSIS — I10 Essential (primary) hypertension: Secondary | ICD-10-CM | POA: Diagnosis not present

## 2016-11-01 DIAGNOSIS — M810 Age-related osteoporosis without current pathological fracture: Secondary | ICD-10-CM | POA: Diagnosis not present

## 2016-11-02 DIAGNOSIS — I10 Essential (primary) hypertension: Secondary | ICD-10-CM | POA: Diagnosis not present

## 2016-11-02 DIAGNOSIS — E785 Hyperlipidemia, unspecified: Secondary | ICD-10-CM | POA: Diagnosis not present

## 2016-11-02 DIAGNOSIS — R410 Disorientation, unspecified: Secondary | ICD-10-CM | POA: Diagnosis not present

## 2016-11-02 DIAGNOSIS — M810 Age-related osteoporosis without current pathological fracture: Secondary | ICD-10-CM | POA: Diagnosis not present

## 2016-11-02 DIAGNOSIS — C61 Malignant neoplasm of prostate: Secondary | ICD-10-CM | POA: Diagnosis not present

## 2016-11-02 DIAGNOSIS — M6281 Muscle weakness (generalized): Secondary | ICD-10-CM | POA: Diagnosis not present

## 2016-11-06 DIAGNOSIS — M6281 Muscle weakness (generalized): Secondary | ICD-10-CM | POA: Diagnosis not present

## 2016-11-06 DIAGNOSIS — I10 Essential (primary) hypertension: Secondary | ICD-10-CM | POA: Diagnosis not present

## 2016-11-06 DIAGNOSIS — C61 Malignant neoplasm of prostate: Secondary | ICD-10-CM | POA: Diagnosis not present

## 2016-11-06 DIAGNOSIS — M8580 Other specified disorders of bone density and structure, unspecified site: Secondary | ICD-10-CM | POA: Diagnosis not present

## 2016-11-06 DIAGNOSIS — E785 Hyperlipidemia, unspecified: Secondary | ICD-10-CM | POA: Diagnosis not present

## 2016-11-06 DIAGNOSIS — M810 Age-related osteoporosis without current pathological fracture: Secondary | ICD-10-CM | POA: Diagnosis not present

## 2016-11-06 DIAGNOSIS — R410 Disorientation, unspecified: Secondary | ICD-10-CM | POA: Diagnosis not present

## 2016-11-07 ENCOUNTER — Other Ambulatory Visit: Payer: Self-pay

## 2016-11-07 DIAGNOSIS — C61 Malignant neoplasm of prostate: Secondary | ICD-10-CM | POA: Diagnosis not present

## 2016-11-07 DIAGNOSIS — M6281 Muscle weakness (generalized): Secondary | ICD-10-CM | POA: Diagnosis not present

## 2016-11-07 DIAGNOSIS — E785 Hyperlipidemia, unspecified: Secondary | ICD-10-CM | POA: Diagnosis not present

## 2016-11-07 DIAGNOSIS — R410 Disorientation, unspecified: Secondary | ICD-10-CM | POA: Diagnosis not present

## 2016-11-07 DIAGNOSIS — M810 Age-related osteoporosis without current pathological fracture: Secondary | ICD-10-CM | POA: Diagnosis not present

## 2016-11-07 DIAGNOSIS — I10 Essential (primary) hypertension: Secondary | ICD-10-CM | POA: Diagnosis not present

## 2016-11-07 NOTE — Patient Outreach (Signed)
Transition of care: Placed call and spoke with daughter [name] at patients request.  [name] reports that patient is doing some better. Reports that patient has MD appointment tomorrow to get an RX for a wheelchair.  Daughter denies any falls since last home visit last week.    PLAN: will continue weekly transition of care calls.  Tomasa Rand, RN, BSN, CEN [facility] [employer] [phone]

## 2016-11-08 DIAGNOSIS — M172 Bilateral post-traumatic osteoarthritis of knee: Secondary | ICD-10-CM | POA: Diagnosis not present

## 2016-11-08 DIAGNOSIS — R269 Unspecified abnormalities of gait and mobility: Secondary | ICD-10-CM | POA: Diagnosis not present

## 2016-11-08 DIAGNOSIS — R296 Repeated falls: Secondary | ICD-10-CM | POA: Diagnosis not present

## 2016-11-09 DIAGNOSIS — C61 Malignant neoplasm of prostate: Secondary | ICD-10-CM | POA: Diagnosis not present

## 2016-11-09 DIAGNOSIS — M810 Age-related osteoporosis without current pathological fracture: Secondary | ICD-10-CM | POA: Diagnosis not present

## 2016-11-09 DIAGNOSIS — R410 Disorientation, unspecified: Secondary | ICD-10-CM | POA: Diagnosis not present

## 2016-11-09 DIAGNOSIS — I10 Essential (primary) hypertension: Secondary | ICD-10-CM | POA: Diagnosis not present

## 2016-11-09 DIAGNOSIS — M6281 Muscle weakness (generalized): Secondary | ICD-10-CM | POA: Diagnosis not present

## 2016-11-09 DIAGNOSIS — Z789 Other specified health status: Secondary | ICD-10-CM | POA: Diagnosis not present

## 2016-11-09 DIAGNOSIS — E785 Hyperlipidemia, unspecified: Secondary | ICD-10-CM | POA: Diagnosis not present

## 2016-11-09 DIAGNOSIS — I69354 Hemiplegia and hemiparesis following cerebral infarction affecting left non-dominant side: Secondary | ICD-10-CM | POA: Diagnosis not present

## 2016-11-13 ENCOUNTER — Other Ambulatory Visit: Payer: Self-pay | Admitting: *Deleted

## 2016-11-13 NOTE — Patient Outreach (Signed)
[facility]) Care Management  [facility] Social Work  11/13/2016  Lawrence Douglas 01/17/1932 [id number]  Subjective:  "I take care of him"  Objective: CSW to assist patient with commuity based resources to aide in his well-being, quality of life and overall safety/needs.    Current Medications:  Current Outpatient Prescriptions  Medication Sig Dispense Refill  . acetaminophen (TYLENOL) 325 MG tablet Take 2 tablets (650 mg total) by mouth every 6 (six) hours as needed for mild pain (or Fever >/= 101). 30 tablet 0  . amLODipine (NORVASC) 10 MG tablet Take 10 mg by mouth daily.  3  . bicalutamide (CASODEX) 50 MG tablet TAKE 1 TAB DAILY FOR PROSTATE CANCER  3  . CALCIUM CARBONATE PO Take 2 tablets by mouth daily.     . enalapril (VASOTEC) 20 MG tablet Take 20 mg by mouth 2 (two) times daily.   3  . enzalutamide (XTANDI) 40 MG capsule Take 160 mg by mouth daily.    . Omega-3 Fatty Acids (FISH OIL) 600 MG CAPS Take 2 capsules by mouth daily.    Marland Kitchen senna (SENOKOT) 8.6 MG TABS tablet Take 1 tablet (8.6 mg total) by mouth 2 (two) times daily. (Patient not taking: Reported on 10/31/2016) 120 each 0  . simvastatin (ZOCOR) 40 MG tablet Take 40 mg by mouth at bedtime.  3   No current facility-administered medications for this visit.     Functional Status:  In your present state of health, do you have any difficulty performing the following activities: 10/31/2016 09/28/2016  Hearing? Y N  Vision? N Y  Difficulty concentrating or making decisions? Y N  Walking or climbing stairs? Y Y  Dressing or bathing? Y N  Doing errands, shopping? Tempie Donning  Preparing Food and eating ? Y -  Using the Toilet? Y -  In the past six months, have you accidently leaked urine? Y -  Do you have problems with loss of bowel control? N -  Managing your Medications? Y -  Comment daughter manages medications -  Managing your Finances? N -  Housekeeping or managing your Housekeeping? N -  Some recent data  might be hidden    Fall/Depression Screening:  Fall Risk  10/31/2016  Falls in the past year? Yes  Number falls in past yr: 2 or more  Injury with Fall? Yes  Comment left knee and left shoulder  Risk Factor Category  High Fall Risk  Risk for fall due to : History of fall(s)  Follow up Falls prevention discussed   PHQ 2/9 Scores 10/31/2016  PHQ - 2 Score 0    Assessment:  CSW spoke with patient's daughter by phone today. She reports that she lives with her dad (she is on disability) as well as a brother. She was uncertain what resources she has received in the mail from [facility], but did indicate they are managing well.  She denies any financial concerns and is able to provide care to patient as well as drive him to appointments.  She reports her brother was able to repair the ramp and they are working to get a new wheelchair and a tub bench.    Plan: This CSW plans to send a packet of community resources to them and call in 1-2 weeks for further discussion, review and assistance.    [facility] CM Care Plan Problem One     Most Recent Value  Care Plan Problem One  Patient may benefit from community resource linking.  Role Documenting the Problem One  Clinical Social Worker  Care Plan for Problem One  Active  [facility] CM Short Term Goal #1   Patient and daughter will report receipt of resources and understanding of services and how to connect within the next 30 days.  [facility] CM Short Term Goal #1 Start Date  11/13/16  Interventions for Short Term Goal #1  CSW educated daughter and will send resources by mail.        Eduard Clos, MSW, [place] Worker  [place] [phone]

## 2016-11-13 NOTE — Patient Outreach (Signed)
Request received from Janet Caldwell, LCSW to mail patient personal care resources.  Information mailed today. 

## 2016-11-14 ENCOUNTER — Other Ambulatory Visit: Payer: Self-pay

## 2016-11-14 DIAGNOSIS — M6281 Muscle weakness (generalized): Secondary | ICD-10-CM | POA: Diagnosis not present

## 2016-11-14 DIAGNOSIS — C61 Malignant neoplasm of prostate: Secondary | ICD-10-CM | POA: Diagnosis not present

## 2016-11-14 DIAGNOSIS — E785 Hyperlipidemia, unspecified: Secondary | ICD-10-CM | POA: Diagnosis not present

## 2016-11-14 DIAGNOSIS — R410 Disorientation, unspecified: Secondary | ICD-10-CM | POA: Diagnosis not present

## 2016-11-14 DIAGNOSIS — I10 Essential (primary) hypertension: Secondary | ICD-10-CM | POA: Diagnosis not present

## 2016-11-14 DIAGNOSIS — M810 Age-related osteoporosis without current pathological fracture: Secondary | ICD-10-CM | POA: Diagnosis not present

## 2016-11-14 NOTE — Patient Outreach (Signed)
Transition of care:  Placed call to patient and spoke with daughter.  Daughter reports that patient is doing well. Reports ramp is fixed and it makes it easier for her father.  Reports no recent falls.  States things are going well right now. Daughter states that home health continues to work on getting patient a wheelchair.  PLAN: will continue weekly transition of care calls.   Tomasa Rand, RN, BSN, CEN [facility] [employer] [phone]

## 2016-11-16 DIAGNOSIS — E785 Hyperlipidemia, unspecified: Secondary | ICD-10-CM | POA: Diagnosis not present

## 2016-11-16 DIAGNOSIS — I10 Essential (primary) hypertension: Secondary | ICD-10-CM | POA: Diagnosis not present

## 2016-11-16 DIAGNOSIS — M810 Age-related osteoporosis without current pathological fracture: Secondary | ICD-10-CM | POA: Diagnosis not present

## 2016-11-16 DIAGNOSIS — M6281 Muscle weakness (generalized): Secondary | ICD-10-CM | POA: Diagnosis not present

## 2016-11-16 DIAGNOSIS — C61 Malignant neoplasm of prostate: Secondary | ICD-10-CM | POA: Diagnosis not present

## 2016-11-16 DIAGNOSIS — R410 Disorientation, unspecified: Secondary | ICD-10-CM | POA: Diagnosis not present

## 2016-11-20 DIAGNOSIS — E785 Hyperlipidemia, unspecified: Secondary | ICD-10-CM | POA: Diagnosis not present

## 2016-11-20 DIAGNOSIS — I10 Essential (primary) hypertension: Secondary | ICD-10-CM | POA: Diagnosis not present

## 2016-11-20 DIAGNOSIS — C61 Malignant neoplasm of prostate: Secondary | ICD-10-CM | POA: Diagnosis not present

## 2016-11-20 DIAGNOSIS — M6281 Muscle weakness (generalized): Secondary | ICD-10-CM | POA: Diagnosis not present

## 2016-11-20 DIAGNOSIS — M810 Age-related osteoporosis without current pathological fracture: Secondary | ICD-10-CM | POA: Diagnosis not present

## 2016-11-20 DIAGNOSIS — R410 Disorientation, unspecified: Secondary | ICD-10-CM | POA: Diagnosis not present

## 2016-11-21 ENCOUNTER — Other Ambulatory Visit: Payer: Self-pay

## 2016-11-21 DIAGNOSIS — M810 Age-related osteoporosis without current pathological fracture: Secondary | ICD-10-CM | POA: Diagnosis not present

## 2016-11-21 DIAGNOSIS — E785 Hyperlipidemia, unspecified: Secondary | ICD-10-CM | POA: Diagnosis not present

## 2016-11-21 DIAGNOSIS — I10 Essential (primary) hypertension: Secondary | ICD-10-CM | POA: Diagnosis not present

## 2016-11-21 DIAGNOSIS — R410 Disorientation, unspecified: Secondary | ICD-10-CM | POA: Diagnosis not present

## 2016-11-21 DIAGNOSIS — M6281 Muscle weakness (generalized): Secondary | ICD-10-CM | POA: Diagnosis not present

## 2016-11-21 DIAGNOSIS — C61 Malignant neoplasm of prostate: Secondary | ICD-10-CM | POA: Diagnosis not present

## 2016-11-21 NOTE — Patient Outreach (Signed)
Transition of care: Placed call to patient for transition of care. No answer. Left a message requesting a call back.  PLAN: Will continue weekly outreach attempts.  Tomasa Rand, RN, BSN, CEN [facility] [employer] [phone]

## 2016-11-21 NOTE — Patient Outreach (Signed)
Transition of care:  Daughter [name] returned call and states patient is doing better. Reports no recent falls. Reports that patient continues to be active with home health. Reports that she spoke with someone yesterday about his wheelchair.    Denies any new concerns at this time.  PLAN: will continue to follow for weekly transition of care calls.  Tomasa Rand, RN, BSN, CEN [facility] [employer] [phone]

## 2016-11-22 DIAGNOSIS — R296 Repeated falls: Secondary | ICD-10-CM | POA: Diagnosis not present

## 2016-11-22 DIAGNOSIS — M172 Bilateral post-traumatic osteoarthritis of knee: Secondary | ICD-10-CM | POA: Diagnosis not present

## 2016-11-22 DIAGNOSIS — R269 Unspecified abnormalities of gait and mobility: Secondary | ICD-10-CM | POA: Diagnosis not present

## 2016-11-24 ENCOUNTER — Other Ambulatory Visit: Payer: Self-pay | Admitting: *Deleted

## 2016-11-25 NOTE — Patient Outreach (Signed)
  [facility]) Care Management  [facility] Social Work  11/25/2016  Lawrence Douglas Nov 23, 1931 [id number]  Subjective:  "I got my new wheelchair" Objective: CSW to assist patient with commuity based resources to aide in her well-being, quality of life and overall safety/needs.    Encounter Medications:  Outpatient Encounter Prescriptions as of 11/24/2016  Medication Sig  . acetaminophen (TYLENOL) 325 MG tablet Take 2 tablets (650 mg total) by mouth every 6 (six) hours as needed for mild pain (or Fever >/= 101).  Marland Kitchen amLODipine (NORVASC) 10 MG tablet Take 10 mg by mouth daily.  . bicalutamide (CASODEX) 50 MG tablet TAKE 1 TAB DAILY FOR PROSTATE CANCER  . CALCIUM CARBONATE PO Take 2 tablets by mouth daily.   . enalapril (VASOTEC) 20 MG tablet Take 20 mg by mouth 2 (two) times daily.   . enzalutamide (XTANDI) 40 MG capsule Take 160 mg by mouth daily.  . Omega-3 Fatty Acids (FISH OIL) 600 MG CAPS Take 2 capsules by mouth daily.  Marland Kitchen senna (SENOKOT) 8.6 MG TABS tablet Take 1 tablet (8.6 mg total) by mouth 2 (two) times daily. (Patient not taking: Reported on 10/31/2016)  . simvastatin (ZOCOR) 40 MG tablet Take 40 mg by mouth at bedtime.   No facility-administered encounter medications on file as of 11/24/2016.     Functional Status:  In your present state of health, do you have any difficulty performing the following activities: 10/31/2016 09/28/2016  Hearing? Y N  Vision? N Y  Difficulty concentrating or making decisions? Y N  Walking or climbing stairs? Y Y  Dressing or bathing? Y N  Doing errands, shopping? Tempie Donning  Preparing Food and eating ? Y -  Using the Toilet? Y -  In the past six months, have you accidently leaked urine? Y -  Do you have problems with loss of bowel control? N -  Managing your Medications? Y -  Comment daughter manages medications -  Managing your Finances? N -  Housekeeping or managing your Housekeeping? N -  Some recent data might be hidden     Fall/Depression Screening:  PHQ 2/9 Scores 10/31/2016  PHQ - 2 Score 0    Assessment:  CSW spoke with patient and his daughter by phone today. Patient has received his new wheelchair- he and daughter are pleased with this and that the ramp was fixed.  CSW discussed the community resources provided- they deny any questions or current interest in these; daughter stating "I take him to all his appointments". CSW reviewed the resources with daughter. CSW will plan a f/u call to determine if a home visit is desired.   Plan:   [facility] CM Care Plan Problem One     Most Recent Value  Care Plan Problem One  Patient may benefit from community resource linking.  Role Documenting the Problem One  Clinical Social Worker  Care Plan for Problem One  Active  [facility] CM Short Term Goal #1   Patient and daughter will report receipt of resources and understanding of services and how to connect within the next 30 days.  [facility] CM Short Term Goal #1 Start Date  11/13/16  [facility] CM Short Term Goal #1 Met Date  11/24/16  Interventions for Short Term Goal #1  CSW educated daughter and will send resources by mail.       Eduard Clos, MSW, [place] Worker  [place] [phone]

## 2016-11-28 DIAGNOSIS — R410 Disorientation, unspecified: Secondary | ICD-10-CM | POA: Diagnosis not present

## 2016-11-28 DIAGNOSIS — I10 Essential (primary) hypertension: Secondary | ICD-10-CM | POA: Diagnosis not present

## 2016-11-28 DIAGNOSIS — C61 Malignant neoplasm of prostate: Secondary | ICD-10-CM | POA: Diagnosis not present

## 2016-11-28 DIAGNOSIS — E785 Hyperlipidemia, unspecified: Secondary | ICD-10-CM | POA: Diagnosis not present

## 2016-11-28 DIAGNOSIS — M6281 Muscle weakness (generalized): Secondary | ICD-10-CM | POA: Diagnosis not present

## 2016-11-28 DIAGNOSIS — M810 Age-related osteoporosis without current pathological fracture: Secondary | ICD-10-CM | POA: Diagnosis not present

## 2016-11-29 ENCOUNTER — Ambulatory Visit: Payer: Self-pay

## 2016-11-29 ENCOUNTER — Other Ambulatory Visit: Payer: Self-pay

## 2016-11-29 ENCOUNTER — Other Ambulatory Visit: Payer: Self-pay | Admitting: *Deleted

## 2016-11-29 NOTE — Patient Outreach (Signed)
[facility]) Care Management  11/29/2016  Lawrence Douglas 1931-07-19 [id number]   CSW contacted patient by phone today. CSW confirmed receipt of the resources mailed. Per Lawrence Douglas they do not have any questions or concerns related to the resources and deny interest in pursuing any at this time. Per Lawrence Douglas, she does not have any interest in adult day care, sitters or support. She is pleased they were able to get the new wheelchair for patient and deny further needs or support from [place].   CSW will plan to close referral and advise PCP and [facility] RNCM.     [facility] CM Care Plan Problem One     Most Recent Value  Care Plan Problem One  Patient may benefit from community resource linking.  Role Documenting the Problem One  Clinical Social Worker  Care Plan for Problem One  Active  [facility] CM Short Term Goal #1   Patient and daughter will report receipt of resources and understanding of services and how to connect within the next 30 days.  [facility] CM Short Term Goal #1 Start Date  11/13/16  [facility] CM Short Term Goal #1 Met Date  11/29/16  Interventions for Short Term Goal #1  CSW educated daughter and will send resources by mail.       Eduard Clos, MSW, [place] Worker  [place] [phone]

## 2016-11-29 NOTE — Patient Outreach (Signed)
Transition of care: Placed call to patient and spoke with daughter Ledon Snare reports that patient is doing well. Reports no recent falls.  States that patient has his new wheelchair and ramp was repaired.  Reports everything is going well.     Reviewed with [name] that patient has completed transition of care program and that I would follow up via phone in 1 month. She voiced understanding.  PLAN: telephone follow up in 1 month.  Tomasa Rand, RN, BSN, CEN [facility] [employer] [phone]

## 2016-12-23 DIAGNOSIS — R269 Unspecified abnormalities of gait and mobility: Secondary | ICD-10-CM | POA: Diagnosis not present

## 2016-12-29 ENCOUNTER — Other Ambulatory Visit: Payer: Self-pay

## 2016-12-29 NOTE — Patient Outreach (Signed)
Case closure:  Placed call to patient with no answer. Left a message requesting a call back.  [name] daughter called back and states that patient is doing well. Reports no recent falls. Denies any current concerns today.   Reviewed case closure with daughter and she is in agreement. All goals are met.  PLAN: case closure. Will send MD and patient letter. Encouraged daughter to call if needs arise.  Tomasa Rand, RN, BSN, CEN [facility] [employer] [phone]

## 2017-01-01 DIAGNOSIS — Z0000 Encounter for general adult medical examination without abnormal findings: Secondary | ICD-10-CM | POA: Diagnosis not present

## 2017-01-01 DIAGNOSIS — E785 Hyperlipidemia, unspecified: Secondary | ICD-10-CM | POA: Diagnosis not present

## 2017-01-01 DIAGNOSIS — R739 Hyperglycemia, unspecified: Secondary | ICD-10-CM | POA: Diagnosis not present

## 2017-01-01 DIAGNOSIS — Z1331 Encounter for screening for depression: Secondary | ICD-10-CM | POA: Diagnosis not present

## 2017-01-01 DIAGNOSIS — D649 Anemia, unspecified: Secondary | ICD-10-CM | POA: Diagnosis not present

## 2017-01-01 DIAGNOSIS — Z125 Encounter for screening for malignant neoplasm of prostate: Secondary | ICD-10-CM | POA: Diagnosis not present

## 2017-01-01 DIAGNOSIS — I10 Essential (primary) hypertension: Secondary | ICD-10-CM | POA: Diagnosis not present

## 2017-01-01 DIAGNOSIS — Z1211 Encounter for screening for malignant neoplasm of colon: Secondary | ICD-10-CM | POA: Diagnosis not present

## 2017-01-22 DIAGNOSIS — R269 Unspecified abnormalities of gait and mobility: Secondary | ICD-10-CM | POA: Diagnosis not present

## 2017-02-07 DIAGNOSIS — Z1331 Encounter for screening for depression: Secondary | ICD-10-CM | POA: Diagnosis not present

## 2017-02-07 DIAGNOSIS — E785 Hyperlipidemia, unspecified: Secondary | ICD-10-CM | POA: Diagnosis not present

## 2017-02-07 DIAGNOSIS — Z0000 Encounter for general adult medical examination without abnormal findings: Secondary | ICD-10-CM | POA: Diagnosis not present

## 2017-02-07 DIAGNOSIS — Z136 Encounter for screening for cardiovascular disorders: Secondary | ICD-10-CM | POA: Diagnosis not present

## 2017-02-16 DIAGNOSIS — M8580 Other specified disorders of bone density and structure, unspecified site: Secondary | ICD-10-CM | POA: Diagnosis not present

## 2017-02-16 DIAGNOSIS — C61 Malignant neoplasm of prostate: Secondary | ICD-10-CM | POA: Diagnosis not present

## 2017-02-22 DIAGNOSIS — R269 Unspecified abnormalities of gait and mobility: Secondary | ICD-10-CM | POA: Diagnosis not present

## 2017-03-05 DIAGNOSIS — C61 Malignant neoplasm of prostate: Secondary | ICD-10-CM | POA: Diagnosis not present

## 2017-03-12 DIAGNOSIS — N320 Bladder-neck obstruction: Secondary | ICD-10-CM | POA: Diagnosis not present

## 2017-03-12 DIAGNOSIS — C61 Malignant neoplasm of prostate: Secondary | ICD-10-CM | POA: Diagnosis not present

## 2017-03-25 DIAGNOSIS — R269 Unspecified abnormalities of gait and mobility: Secondary | ICD-10-CM | POA: Diagnosis not present

## 2017-04-22 DIAGNOSIS — R269 Unspecified abnormalities of gait and mobility: Secondary | ICD-10-CM | POA: Diagnosis not present

## 2017-05-23 DIAGNOSIS — R269 Unspecified abnormalities of gait and mobility: Secondary | ICD-10-CM | POA: Diagnosis not present

## 2017-05-25 DIAGNOSIS — M8580 Other specified disorders of bone density and structure, unspecified site: Secondary | ICD-10-CM | POA: Diagnosis not present

## 2017-05-25 DIAGNOSIS — C61 Malignant neoplasm of prostate: Secondary | ICD-10-CM | POA: Diagnosis not present

## 2017-06-22 DIAGNOSIS — R269 Unspecified abnormalities of gait and mobility: Secondary | ICD-10-CM | POA: Diagnosis not present

## 2017-07-04 DIAGNOSIS — I10 Essential (primary) hypertension: Secondary | ICD-10-CM | POA: Diagnosis not present

## 2017-07-04 DIAGNOSIS — K219 Gastro-esophageal reflux disease without esophagitis: Secondary | ICD-10-CM | POA: Diagnosis not present

## 2017-07-04 DIAGNOSIS — C61 Malignant neoplasm of prostate: Secondary | ICD-10-CM | POA: Diagnosis not present

## 2017-07-04 DIAGNOSIS — M810 Age-related osteoporosis without current pathological fracture: Secondary | ICD-10-CM | POA: Diagnosis not present

## 2017-07-04 DIAGNOSIS — E785 Hyperlipidemia, unspecified: Secondary | ICD-10-CM | POA: Diagnosis not present

## 2017-07-19 DIAGNOSIS — S63501A Unspecified sprain of right wrist, initial encounter: Secondary | ICD-10-CM | POA: Diagnosis not present

## 2017-07-19 DIAGNOSIS — I10 Essential (primary) hypertension: Secondary | ICD-10-CM | POA: Diagnosis not present

## 2017-07-19 DIAGNOSIS — M79631 Pain in right forearm: Secondary | ICD-10-CM | POA: Diagnosis not present

## 2017-07-19 DIAGNOSIS — R262 Difficulty in walking, not elsewhere classified: Secondary | ICD-10-CM | POA: Diagnosis not present

## 2017-07-19 DIAGNOSIS — R51 Headache: Secondary | ICD-10-CM | POA: Diagnosis not present

## 2017-07-19 DIAGNOSIS — M79641 Pain in right hand: Secondary | ICD-10-CM | POA: Diagnosis not present

## 2017-07-19 DIAGNOSIS — R531 Weakness: Secondary | ICD-10-CM | POA: Diagnosis not present

## 2017-07-19 DIAGNOSIS — M25531 Pain in right wrist: Secondary | ICD-10-CM | POA: Diagnosis not present

## 2017-07-19 DIAGNOSIS — Z87891 Personal history of nicotine dependence: Secondary | ICD-10-CM | POA: Diagnosis not present

## 2017-07-19 DIAGNOSIS — S6991XA Unspecified injury of right wrist, hand and finger(s), initial encounter: Secondary | ICD-10-CM | POA: Diagnosis not present

## 2017-07-19 DIAGNOSIS — R0902 Hypoxemia: Secondary | ICD-10-CM | POA: Diagnosis not present

## 2017-07-19 DIAGNOSIS — S59911A Unspecified injury of right forearm, initial encounter: Secondary | ICD-10-CM | POA: Diagnosis not present

## 2017-07-19 DIAGNOSIS — S0990XA Unspecified injury of head, initial encounter: Secondary | ICD-10-CM | POA: Diagnosis not present

## 2017-07-23 DIAGNOSIS — R269 Unspecified abnormalities of gait and mobility: Secondary | ICD-10-CM | POA: Diagnosis not present

## 2017-07-24 ENCOUNTER — Other Ambulatory Visit: Payer: Self-pay | Admitting: Urology

## 2017-07-24 DIAGNOSIS — C61 Malignant neoplasm of prostate: Secondary | ICD-10-CM

## 2017-07-24 DIAGNOSIS — N320 Bladder-neck obstruction: Secondary | ICD-10-CM | POA: Diagnosis not present

## 2017-07-26 DIAGNOSIS — H2511 Age-related nuclear cataract, right eye: Secondary | ICD-10-CM | POA: Diagnosis not present

## 2017-07-26 DIAGNOSIS — Z970 Presence of artificial eye: Secondary | ICD-10-CM | POA: Diagnosis not present

## 2017-08-03 DIAGNOSIS — R296 Repeated falls: Secondary | ICD-10-CM | POA: Diagnosis not present

## 2017-08-03 DIAGNOSIS — Z79899 Other long term (current) drug therapy: Secondary | ICD-10-CM | POA: Diagnosis not present

## 2017-08-15 ENCOUNTER — Encounter (HOSPITAL_COMMUNITY)
Admission: RE | Admit: 2017-08-15 | Discharge: 2017-08-15 | Disposition: A | Discharge status: Home / Self Care | Payer: Medicare Other | Source: Ambulatory Visit | Attending: Urology | Admitting: Urology

## 2017-08-15 DIAGNOSIS — C61 Malignant neoplasm of prostate: Secondary | ICD-10-CM | POA: Diagnosis present

## 2017-08-15 MED ORDER — TECHNETIUM TC 99M MEDRONATE IV KIT
20.0000 | PACK | Freq: Once | INTRAVENOUS | Status: AC | PRN
  Administered 2017-08-15: 21.9 via INTRAVENOUS

## 2017-08-22 DIAGNOSIS — R269 Unspecified abnormalities of gait and mobility: Secondary | ICD-10-CM | POA: Diagnosis not present

## 2017-08-31 DIAGNOSIS — C61 Malignant neoplasm of prostate: Secondary | ICD-10-CM | POA: Diagnosis not present

## 2017-08-31 DIAGNOSIS — M8580 Other specified disorders of bone density and structure, unspecified site: Secondary | ICD-10-CM | POA: Diagnosis not present

## 2017-09-04 DIAGNOSIS — C61 Malignant neoplasm of prostate: Secondary | ICD-10-CM | POA: Diagnosis not present

## 2017-09-04 DIAGNOSIS — N320 Bladder-neck obstruction: Secondary | ICD-10-CM | POA: Diagnosis not present

## 2017-09-04 DIAGNOSIS — C7951 Secondary malignant neoplasm of bone: Secondary | ICD-10-CM | POA: Diagnosis not present

## 2017-10-04 DIAGNOSIS — M810 Age-related osteoporosis without current pathological fracture: Secondary | ICD-10-CM | POA: Diagnosis not present

## 2017-10-04 DIAGNOSIS — Z79899 Other long term (current) drug therapy: Secondary | ICD-10-CM | POA: Diagnosis not present

## 2017-10-04 DIAGNOSIS — E785 Hyperlipidemia, unspecified: Secondary | ICD-10-CM | POA: Diagnosis not present

## 2017-10-04 DIAGNOSIS — I10 Essential (primary) hypertension: Secondary | ICD-10-CM | POA: Diagnosis not present

## 2017-10-04 DIAGNOSIS — K219 Gastro-esophageal reflux disease without esophagitis: Secondary | ICD-10-CM | POA: Diagnosis not present

## 2017-10-09 DIAGNOSIS — N320 Bladder-neck obstruction: Secondary | ICD-10-CM | POA: Diagnosis not present

## 2017-10-09 DIAGNOSIS — C7951 Secondary malignant neoplasm of bone: Secondary | ICD-10-CM | POA: Diagnosis not present

## 2017-12-07 DIAGNOSIS — C7951 Secondary malignant neoplasm of bone: Secondary | ICD-10-CM | POA: Diagnosis not present

## 2018-01-07 DIAGNOSIS — C7951 Secondary malignant neoplasm of bone: Secondary | ICD-10-CM | POA: Diagnosis not present

## 2018-01-07 DIAGNOSIS — M8580 Other specified disorders of bone density and structure, unspecified site: Secondary | ICD-10-CM | POA: Diagnosis not present

## 2018-02-12 DIAGNOSIS — Z139 Encounter for screening, unspecified: Secondary | ICD-10-CM | POA: Diagnosis not present

## 2018-02-12 DIAGNOSIS — Z0000 Encounter for general adult medical examination without abnormal findings: Secondary | ICD-10-CM | POA: Diagnosis not present

## 2018-02-12 DIAGNOSIS — E785 Hyperlipidemia, unspecified: Secondary | ICD-10-CM | POA: Diagnosis not present

## 2018-02-12 DIAGNOSIS — Z9181 History of falling: Secondary | ICD-10-CM | POA: Diagnosis not present

## 2018-02-24 DIAGNOSIS — S42002A Fracture of unspecified part of left clavicle, initial encounter for closed fracture: Secondary | ICD-10-CM | POA: Diagnosis not present

## 2018-03-05 DIAGNOSIS — S42002A Fracture of unspecified part of left clavicle, initial encounter for closed fracture: Secondary | ICD-10-CM | POA: Diagnosis not present

## 2018-03-08 DIAGNOSIS — Z7952 Long term (current) use of systemic steroids: Secondary | ICD-10-CM | POA: Diagnosis not present

## 2018-03-08 DIAGNOSIS — I10 Essential (primary) hypertension: Secondary | ICD-10-CM | POA: Diagnosis not present

## 2018-03-08 DIAGNOSIS — S42002D Fracture of unspecified part of left clavicle, subsequent encounter for fracture with routine healing: Secondary | ICD-10-CM | POA: Diagnosis not present

## 2018-03-08 DIAGNOSIS — Z9181 History of falling: Secondary | ICD-10-CM | POA: Diagnosis not present

## 2018-03-12 DIAGNOSIS — I10 Essential (primary) hypertension: Secondary | ICD-10-CM | POA: Diagnosis not present

## 2018-03-12 DIAGNOSIS — Z7952 Long term (current) use of systemic steroids: Secondary | ICD-10-CM | POA: Diagnosis not present

## 2018-03-12 DIAGNOSIS — S42002D Fracture of unspecified part of left clavicle, subsequent encounter for fracture with routine healing: Secondary | ICD-10-CM | POA: Diagnosis not present

## 2018-03-12 DIAGNOSIS — Z9181 History of falling: Secondary | ICD-10-CM | POA: Diagnosis not present

## 2018-03-14 DIAGNOSIS — Z7952 Long term (current) use of systemic steroids: Secondary | ICD-10-CM | POA: Diagnosis not present

## 2018-03-14 DIAGNOSIS — I10 Essential (primary) hypertension: Secondary | ICD-10-CM | POA: Diagnosis not present

## 2018-03-14 DIAGNOSIS — S42002D Fracture of unspecified part of left clavicle, subsequent encounter for fracture with routine healing: Secondary | ICD-10-CM | POA: Diagnosis not present

## 2018-03-14 DIAGNOSIS — Z9181 History of falling: Secondary | ICD-10-CM | POA: Diagnosis not present

## 2018-03-15 DIAGNOSIS — C7951 Secondary malignant neoplasm of bone: Secondary | ICD-10-CM | POA: Diagnosis not present

## 2018-03-18 DIAGNOSIS — Z7952 Long term (current) use of systemic steroids: Secondary | ICD-10-CM | POA: Diagnosis not present

## 2018-03-18 DIAGNOSIS — Z9181 History of falling: Secondary | ICD-10-CM | POA: Diagnosis not present

## 2018-03-18 DIAGNOSIS — S42002D Fracture of unspecified part of left clavicle, subsequent encounter for fracture with routine healing: Secondary | ICD-10-CM | POA: Diagnosis not present

## 2018-03-18 DIAGNOSIS — I10 Essential (primary) hypertension: Secondary | ICD-10-CM | POA: Diagnosis not present

## 2018-03-20 DIAGNOSIS — I10 Essential (primary) hypertension: Secondary | ICD-10-CM | POA: Diagnosis not present

## 2018-03-20 DIAGNOSIS — Z7952 Long term (current) use of systemic steroids: Secondary | ICD-10-CM | POA: Diagnosis not present

## 2018-03-20 DIAGNOSIS — Z9181 History of falling: Secondary | ICD-10-CM | POA: Diagnosis not present

## 2018-03-20 DIAGNOSIS — S42002D Fracture of unspecified part of left clavicle, subsequent encounter for fracture with routine healing: Secondary | ICD-10-CM | POA: Diagnosis not present

## 2018-03-26 DIAGNOSIS — S42002D Fracture of unspecified part of left clavicle, subsequent encounter for fracture with routine healing: Secondary | ICD-10-CM | POA: Diagnosis not present

## 2018-03-26 DIAGNOSIS — Z9181 History of falling: Secondary | ICD-10-CM | POA: Diagnosis not present

## 2018-03-26 DIAGNOSIS — I10 Essential (primary) hypertension: Secondary | ICD-10-CM | POA: Diagnosis not present

## 2018-03-26 DIAGNOSIS — Z7952 Long term (current) use of systemic steroids: Secondary | ICD-10-CM | POA: Diagnosis not present

## 2018-03-28 DIAGNOSIS — Z7952 Long term (current) use of systemic steroids: Secondary | ICD-10-CM | POA: Diagnosis not present

## 2018-03-28 DIAGNOSIS — I10 Essential (primary) hypertension: Secondary | ICD-10-CM | POA: Diagnosis not present

## 2018-03-28 DIAGNOSIS — Z9181 History of falling: Secondary | ICD-10-CM | POA: Diagnosis not present

## 2018-03-28 DIAGNOSIS — S42002D Fracture of unspecified part of left clavicle, subsequent encounter for fracture with routine healing: Secondary | ICD-10-CM | POA: Diagnosis not present

## 2018-04-02 DIAGNOSIS — I10 Essential (primary) hypertension: Secondary | ICD-10-CM | POA: Diagnosis not present

## 2018-04-02 DIAGNOSIS — Z9181 History of falling: Secondary | ICD-10-CM | POA: Diagnosis not present

## 2018-04-02 DIAGNOSIS — S42002D Fracture of unspecified part of left clavicle, subsequent encounter for fracture with routine healing: Secondary | ICD-10-CM | POA: Diagnosis not present

## 2018-04-02 DIAGNOSIS — Z7952 Long term (current) use of systemic steroids: Secondary | ICD-10-CM | POA: Diagnosis not present

## 2018-04-04 DIAGNOSIS — I10 Essential (primary) hypertension: Secondary | ICD-10-CM | POA: Diagnosis not present

## 2018-04-04 DIAGNOSIS — Z7952 Long term (current) use of systemic steroids: Secondary | ICD-10-CM | POA: Diagnosis not present

## 2018-04-04 DIAGNOSIS — S42002D Fracture of unspecified part of left clavicle, subsequent encounter for fracture with routine healing: Secondary | ICD-10-CM | POA: Diagnosis not present

## 2018-04-04 DIAGNOSIS — Z9181 History of falling: Secondary | ICD-10-CM | POA: Diagnosis not present

## 2018-04-11 DIAGNOSIS — C7951 Secondary malignant neoplasm of bone: Secondary | ICD-10-CM | POA: Diagnosis not present

## 2018-04-11 DIAGNOSIS — N320 Bladder-neck obstruction: Secondary | ICD-10-CM | POA: Diagnosis not present

## 2018-04-16 DIAGNOSIS — R739 Hyperglycemia, unspecified: Secondary | ICD-10-CM | POA: Diagnosis not present

## 2018-04-16 DIAGNOSIS — E785 Hyperlipidemia, unspecified: Secondary | ICD-10-CM | POA: Diagnosis not present

## 2018-04-16 DIAGNOSIS — I10 Essential (primary) hypertension: Secondary | ICD-10-CM | POA: Diagnosis not present

## 2018-04-16 DIAGNOSIS — M810 Age-related osteoporosis without current pathological fracture: Secondary | ICD-10-CM | POA: Diagnosis not present

## 2018-04-16 DIAGNOSIS — K219 Gastro-esophageal reflux disease without esophagitis: Secondary | ICD-10-CM | POA: Diagnosis not present

## 2018-06-21 DIAGNOSIS — C7951 Secondary malignant neoplasm of bone: Secondary | ICD-10-CM | POA: Diagnosis not present

## 2018-06-21 DIAGNOSIS — M8580 Other specified disorders of bone density and structure, unspecified site: Secondary | ICD-10-CM | POA: Diagnosis not present

## 2018-07-15 DIAGNOSIS — N320 Bladder-neck obstruction: Secondary | ICD-10-CM | POA: Diagnosis not present

## 2018-07-15 DIAGNOSIS — M8580 Other specified disorders of bone density and structure, unspecified site: Secondary | ICD-10-CM | POA: Diagnosis not present

## 2018-07-15 DIAGNOSIS — C7951 Secondary malignant neoplasm of bone: Secondary | ICD-10-CM | POA: Diagnosis not present

## 2018-09-20 DIAGNOSIS — M8580 Other specified disorders of bone density and structure, unspecified site: Secondary | ICD-10-CM | POA: Diagnosis not present

## 2018-09-20 DIAGNOSIS — C7951 Secondary malignant neoplasm of bone: Secondary | ICD-10-CM | POA: Diagnosis not present

## 2018-09-29 DIAGNOSIS — S199XXA Unspecified injury of neck, initial encounter: Secondary | ICD-10-CM | POA: Diagnosis not present

## 2018-09-29 DIAGNOSIS — R000 Tachycardia, unspecified: Secondary | ICD-10-CM | POA: Diagnosis not present

## 2018-09-29 DIAGNOSIS — L03113 Cellulitis of right upper limb: Secondary | ICD-10-CM | POA: Diagnosis not present

## 2018-09-29 DIAGNOSIS — A419 Sepsis, unspecified organism: Secondary | ICD-10-CM | POA: Diagnosis not present

## 2018-09-29 DIAGNOSIS — S0990XA Unspecified injury of head, initial encounter: Secondary | ICD-10-CM | POA: Diagnosis not present

## 2018-09-29 DIAGNOSIS — S60221A Contusion of right hand, initial encounter: Secondary | ICD-10-CM | POA: Diagnosis not present

## 2018-09-29 DIAGNOSIS — S299XXA Unspecified injury of thorax, initial encounter: Secondary | ICD-10-CM | POA: Diagnosis not present

## 2018-09-29 DIAGNOSIS — S0093XA Contusion of unspecified part of head, initial encounter: Secondary | ICD-10-CM | POA: Diagnosis not present

## 2018-09-29 DIAGNOSIS — S60211A Contusion of right wrist, initial encounter: Secondary | ICD-10-CM | POA: Diagnosis not present

## 2018-09-29 DIAGNOSIS — S62521A Displaced fracture of distal phalanx of right thumb, initial encounter for closed fracture: Secondary | ICD-10-CM | POA: Diagnosis not present

## 2018-09-30 DIAGNOSIS — S62521A Displaced fracture of distal phalanx of right thumb, initial encounter for closed fracture: Secondary | ICD-10-CM | POA: Diagnosis not present

## 2018-09-30 DIAGNOSIS — I340 Nonrheumatic mitral (valve) insufficiency: Secondary | ICD-10-CM | POA: Diagnosis not present

## 2018-09-30 DIAGNOSIS — Z87891 Personal history of nicotine dependence: Secondary | ICD-10-CM | POA: Diagnosis not present

## 2018-09-30 DIAGNOSIS — Z7401 Bed confinement status: Secondary | ICD-10-CM | POA: Diagnosis not present

## 2018-09-30 DIAGNOSIS — R000 Tachycardia, unspecified: Secondary | ICD-10-CM | POA: Diagnosis not present

## 2018-09-30 DIAGNOSIS — M6282 Rhabdomyolysis: Secondary | ICD-10-CM | POA: Diagnosis not present

## 2018-09-30 DIAGNOSIS — I6389 Other cerebral infarction: Secondary | ICD-10-CM | POA: Diagnosis not present

## 2018-09-30 DIAGNOSIS — R278 Other lack of coordination: Secondary | ICD-10-CM | POA: Diagnosis not present

## 2018-09-30 DIAGNOSIS — E46 Unspecified protein-calorie malnutrition: Secondary | ICD-10-CM | POA: Diagnosis not present

## 2018-09-30 DIAGNOSIS — D649 Anemia, unspecified: Secondary | ICD-10-CM | POA: Diagnosis not present

## 2018-09-30 DIAGNOSIS — I361 Nonrheumatic tricuspid (valve) insufficiency: Secondary | ICD-10-CM | POA: Diagnosis not present

## 2018-09-30 DIAGNOSIS — N390 Urinary tract infection, site not specified: Secondary | ICD-10-CM | POA: Diagnosis not present

## 2018-09-30 DIAGNOSIS — S0990XA Unspecified injury of head, initial encounter: Secondary | ICD-10-CM | POA: Diagnosis not present

## 2018-09-30 DIAGNOSIS — E878 Other disorders of electrolyte and fluid balance, not elsewhere classified: Secondary | ICD-10-CM | POA: Diagnosis not present

## 2018-09-30 DIAGNOSIS — Z23 Encounter for immunization: Secondary | ICD-10-CM | POA: Diagnosis not present

## 2018-09-30 DIAGNOSIS — B964 Proteus (mirabilis) (morganii) as the cause of diseases classified elsewhere: Secondary | ICD-10-CM | POA: Diagnosis not present

## 2018-09-30 DIAGNOSIS — K30 Functional dyspepsia: Secondary | ICD-10-CM | POA: Diagnosis not present

## 2018-09-30 DIAGNOSIS — E785 Hyperlipidemia, unspecified: Secondary | ICD-10-CM | POA: Diagnosis not present

## 2018-09-30 DIAGNOSIS — R739 Hyperglycemia, unspecified: Secondary | ICD-10-CM | POA: Diagnosis not present

## 2018-09-30 DIAGNOSIS — I10 Essential (primary) hypertension: Secondary | ICD-10-CM | POA: Diagnosis not present

## 2018-09-30 DIAGNOSIS — S0093XA Contusion of unspecified part of head, initial encounter: Secondary | ICD-10-CM | POA: Diagnosis not present

## 2018-09-30 DIAGNOSIS — R1312 Dysphagia, oropharyngeal phase: Secondary | ICD-10-CM | POA: Diagnosis not present

## 2018-09-30 DIAGNOSIS — E876 Hypokalemia: Secondary | ICD-10-CM | POA: Diagnosis not present

## 2018-09-30 DIAGNOSIS — M2550 Pain in unspecified joint: Secondary | ICD-10-CM | POA: Diagnosis not present

## 2018-09-30 DIAGNOSIS — A419 Sepsis, unspecified organism: Secondary | ICD-10-CM | POA: Diagnosis not present

## 2018-09-30 DIAGNOSIS — Z8673 Personal history of transient ischemic attack (TIA), and cerebral infarction without residual deficits: Secondary | ICD-10-CM | POA: Diagnosis not present

## 2018-09-30 DIAGNOSIS — F0390 Unspecified dementia without behavioral disturbance: Secondary | ICD-10-CM | POA: Diagnosis not present

## 2018-09-30 DIAGNOSIS — S299XXA Unspecified injury of thorax, initial encounter: Secondary | ICD-10-CM | POA: Diagnosis not present

## 2018-09-30 DIAGNOSIS — M6281 Muscle weakness (generalized): Secondary | ICD-10-CM | POA: Diagnosis not present

## 2018-09-30 DIAGNOSIS — N3001 Acute cystitis with hematuria: Secondary | ICD-10-CM | POA: Diagnosis not present

## 2018-09-30 DIAGNOSIS — S60221A Contusion of right hand, initial encounter: Secondary | ICD-10-CM | POA: Diagnosis not present

## 2018-09-30 DIAGNOSIS — S60211A Contusion of right wrist, initial encounter: Secondary | ICD-10-CM | POA: Diagnosis not present

## 2018-09-30 DIAGNOSIS — C7951 Secondary malignant neoplasm of bone: Secondary | ICD-10-CM | POA: Diagnosis not present

## 2018-09-30 DIAGNOSIS — T796XXA Traumatic ischemia of muscle, initial encounter: Secondary | ICD-10-CM | POA: Diagnosis not present

## 2018-09-30 DIAGNOSIS — L03113 Cellulitis of right upper limb: Secondary | ICD-10-CM | POA: Diagnosis not present

## 2018-09-30 DIAGNOSIS — W109XXD Fall (on) (from) unspecified stairs and steps, subsequent encounter: Secondary | ICD-10-CM | POA: Diagnosis not present

## 2018-09-30 DIAGNOSIS — R2681 Unsteadiness on feet: Secondary | ICD-10-CM | POA: Diagnosis not present

## 2018-09-30 DIAGNOSIS — I352 Nonrheumatic aortic (valve) stenosis with insufficiency: Secondary | ICD-10-CM | POA: Diagnosis not present

## 2018-09-30 DIAGNOSIS — S199XXA Unspecified injury of neck, initial encounter: Secondary | ICD-10-CM | POA: Diagnosis not present

## 2018-09-30 DIAGNOSIS — R410 Disorientation, unspecified: Secondary | ICD-10-CM | POA: Diagnosis not present

## 2018-09-30 DIAGNOSIS — L0390 Cellulitis, unspecified: Secondary | ICD-10-CM | POA: Diagnosis not present

## 2018-09-30 DIAGNOSIS — S62521D Displaced fracture of distal phalanx of right thumb, subsequent encounter for fracture with routine healing: Secondary | ICD-10-CM | POA: Diagnosis not present

## 2018-10-01 DIAGNOSIS — S62521A Displaced fracture of distal phalanx of right thumb, initial encounter for closed fracture: Secondary | ICD-10-CM | POA: Diagnosis not present

## 2018-10-01 DIAGNOSIS — F0390 Unspecified dementia without behavioral disturbance: Secondary | ICD-10-CM | POA: Diagnosis not present

## 2018-10-01 DIAGNOSIS — N390 Urinary tract infection, site not specified: Secondary | ICD-10-CM | POA: Diagnosis not present

## 2018-10-01 DIAGNOSIS — L03113 Cellulitis of right upper limb: Secondary | ICD-10-CM | POA: Diagnosis not present

## 2018-10-02 DIAGNOSIS — N390 Urinary tract infection, site not specified: Secondary | ICD-10-CM | POA: Diagnosis not present

## 2018-10-02 DIAGNOSIS — F0390 Unspecified dementia without behavioral disturbance: Secondary | ICD-10-CM | POA: Diagnosis not present

## 2018-10-02 DIAGNOSIS — S62521A Displaced fracture of distal phalanx of right thumb, initial encounter for closed fracture: Secondary | ICD-10-CM | POA: Diagnosis not present

## 2018-10-02 DIAGNOSIS — L03113 Cellulitis of right upper limb: Secondary | ICD-10-CM | POA: Diagnosis not present

## 2018-10-03 DIAGNOSIS — N390 Urinary tract infection, site not specified: Secondary | ICD-10-CM | POA: Diagnosis not present

## 2018-10-03 DIAGNOSIS — F0390 Unspecified dementia without behavioral disturbance: Secondary | ICD-10-CM | POA: Diagnosis not present

## 2018-10-03 DIAGNOSIS — L03113 Cellulitis of right upper limb: Secondary | ICD-10-CM | POA: Diagnosis not present

## 2018-10-03 DIAGNOSIS — S62521A Displaced fracture of distal phalanx of right thumb, initial encounter for closed fracture: Secondary | ICD-10-CM | POA: Diagnosis not present

## 2018-10-04 DIAGNOSIS — D649 Anemia, unspecified: Secondary | ICD-10-CM | POA: Diagnosis not present

## 2018-10-04 DIAGNOSIS — A419 Sepsis, unspecified organism: Secondary | ICD-10-CM | POA: Diagnosis not present

## 2018-10-04 DIAGNOSIS — T796XXA Traumatic ischemia of muscle, initial encounter: Secondary | ICD-10-CM | POA: Diagnosis not present

## 2018-10-04 DIAGNOSIS — Z23 Encounter for immunization: Secondary | ICD-10-CM | POA: Diagnosis not present

## 2018-10-04 DIAGNOSIS — S62521D Displaced fracture of distal phalanx of right thumb, subsequent encounter for fracture with routine healing: Secondary | ICD-10-CM | POA: Diagnosis not present

## 2018-10-04 DIAGNOSIS — M79644 Pain in right finger(s): Secondary | ICD-10-CM | POA: Diagnosis not present

## 2018-10-04 DIAGNOSIS — R739 Hyperglycemia, unspecified: Secondary | ICD-10-CM | POA: Diagnosis not present

## 2018-10-04 DIAGNOSIS — M6282 Rhabdomyolysis: Secondary | ICD-10-CM | POA: Diagnosis not present

## 2018-10-04 DIAGNOSIS — B964 Proteus (mirabilis) (morganii) as the cause of diseases classified elsewhere: Secondary | ICD-10-CM | POA: Diagnosis not present

## 2018-10-04 DIAGNOSIS — R1312 Dysphagia, oropharyngeal phase: Secondary | ICD-10-CM | POA: Diagnosis not present

## 2018-10-04 DIAGNOSIS — L03113 Cellulitis of right upper limb: Secondary | ICD-10-CM | POA: Diagnosis not present

## 2018-10-04 DIAGNOSIS — K30 Functional dyspepsia: Secondary | ICD-10-CM | POA: Diagnosis not present

## 2018-10-04 DIAGNOSIS — R2681 Unsteadiness on feet: Secondary | ICD-10-CM | POA: Diagnosis not present

## 2018-10-04 DIAGNOSIS — L0390 Cellulitis, unspecified: Secondary | ICD-10-CM | POA: Diagnosis not present

## 2018-10-04 DIAGNOSIS — N390 Urinary tract infection, site not specified: Secondary | ICD-10-CM | POA: Diagnosis not present

## 2018-10-04 DIAGNOSIS — C7951 Secondary malignant neoplasm of bone: Secondary | ICD-10-CM | POA: Diagnosis not present

## 2018-10-04 DIAGNOSIS — E46 Unspecified protein-calorie malnutrition: Secondary | ICD-10-CM | POA: Diagnosis not present

## 2018-10-04 DIAGNOSIS — I6389 Other cerebral infarction: Secondary | ICD-10-CM | POA: Diagnosis not present

## 2018-10-04 DIAGNOSIS — W109XXD Fall (on) (from) unspecified stairs and steps, subsequent encounter: Secondary | ICD-10-CM | POA: Diagnosis not present

## 2018-10-04 DIAGNOSIS — I10 Essential (primary) hypertension: Secondary | ICD-10-CM | POA: Diagnosis not present

## 2018-10-04 DIAGNOSIS — E878 Other disorders of electrolyte and fluid balance, not elsewhere classified: Secondary | ICD-10-CM | POA: Diagnosis not present

## 2018-10-04 DIAGNOSIS — F0390 Unspecified dementia without behavioral disturbance: Secondary | ICD-10-CM | POA: Diagnosis not present

## 2018-10-04 DIAGNOSIS — E785 Hyperlipidemia, unspecified: Secondary | ICD-10-CM | POA: Diagnosis not present

## 2018-10-04 DIAGNOSIS — M6281 Muscle weakness (generalized): Secondary | ICD-10-CM | POA: Diagnosis not present

## 2018-10-04 DIAGNOSIS — N3001 Acute cystitis with hematuria: Secondary | ICD-10-CM | POA: Diagnosis not present

## 2018-10-04 DIAGNOSIS — Z7401 Bed confinement status: Secondary | ICD-10-CM | POA: Diagnosis not present

## 2018-10-04 DIAGNOSIS — S62521A Displaced fracture of distal phalanx of right thumb, initial encounter for closed fracture: Secondary | ICD-10-CM | POA: Diagnosis not present

## 2018-10-04 DIAGNOSIS — E876 Hypokalemia: Secondary | ICD-10-CM | POA: Diagnosis not present

## 2018-10-04 DIAGNOSIS — Z87891 Personal history of nicotine dependence: Secondary | ICD-10-CM | POA: Diagnosis not present

## 2018-10-04 DIAGNOSIS — Z8673 Personal history of transient ischemic attack (TIA), and cerebral infarction without residual deficits: Secondary | ICD-10-CM | POA: Diagnosis not present

## 2018-10-04 DIAGNOSIS — R6889 Other general symptoms and signs: Secondary | ICD-10-CM | POA: Diagnosis not present

## 2018-10-04 DIAGNOSIS — M2550 Pain in unspecified joint: Secondary | ICD-10-CM | POA: Diagnosis not present

## 2018-10-04 DIAGNOSIS — R278 Other lack of coordination: Secondary | ICD-10-CM | POA: Diagnosis not present

## 2018-10-04 DIAGNOSIS — R410 Disorientation, unspecified: Secondary | ICD-10-CM | POA: Diagnosis not present

## 2018-10-05 DIAGNOSIS — I10 Essential (primary) hypertension: Secondary | ICD-10-CM | POA: Diagnosis not present

## 2018-10-05 DIAGNOSIS — R2681 Unsteadiness on feet: Secondary | ICD-10-CM | POA: Diagnosis not present

## 2018-10-05 DIAGNOSIS — E46 Unspecified protein-calorie malnutrition: Secondary | ICD-10-CM | POA: Diagnosis not present

## 2018-10-05 DIAGNOSIS — M6282 Rhabdomyolysis: Secondary | ICD-10-CM | POA: Diagnosis not present

## 2018-10-16 DIAGNOSIS — D649 Anemia, unspecified: Secondary | ICD-10-CM | POA: Diagnosis not present

## 2018-10-16 DIAGNOSIS — R6889 Other general symptoms and signs: Secondary | ICD-10-CM | POA: Diagnosis not present

## 2018-10-18 DIAGNOSIS — M79644 Pain in right finger(s): Secondary | ICD-10-CM | POA: Diagnosis not present

## 2018-10-27 DIAGNOSIS — R2681 Unsteadiness on feet: Secondary | ICD-10-CM | POA: Diagnosis not present

## 2018-10-27 DIAGNOSIS — E46 Unspecified protein-calorie malnutrition: Secondary | ICD-10-CM | POA: Diagnosis not present

## 2018-11-03 DIAGNOSIS — M6281 Muscle weakness (generalized): Secondary | ICD-10-CM | POA: Diagnosis not present

## 2018-11-03 DIAGNOSIS — H5461 Unqualified visual loss, right eye, normal vision left eye: Secondary | ICD-10-CM | POA: Diagnosis not present

## 2018-11-03 DIAGNOSIS — R278 Other lack of coordination: Secondary | ICD-10-CM | POA: Diagnosis not present

## 2018-11-03 DIAGNOSIS — I340 Nonrheumatic mitral (valve) insufficiency: Secondary | ICD-10-CM | POA: Diagnosis not present

## 2018-11-03 DIAGNOSIS — Z79899 Other long term (current) drug therapy: Secondary | ICD-10-CM | POA: Diagnosis not present

## 2018-11-03 DIAGNOSIS — W109XXD Fall (on) (from) unspecified stairs and steps, subsequent encounter: Secondary | ICD-10-CM | POA: Diagnosis not present

## 2018-11-03 DIAGNOSIS — E785 Hyperlipidemia, unspecified: Secondary | ICD-10-CM | POA: Diagnosis not present

## 2018-11-03 DIAGNOSIS — E786 Lipoprotein deficiency: Secondary | ICD-10-CM | POA: Diagnosis not present

## 2018-11-03 DIAGNOSIS — K219 Gastro-esophageal reflux disease without esophagitis: Secondary | ICD-10-CM | POA: Diagnosis not present

## 2018-11-03 DIAGNOSIS — H9193 Unspecified hearing loss, bilateral: Secondary | ICD-10-CM | POA: Diagnosis not present

## 2018-11-03 DIAGNOSIS — M6282 Rhabdomyolysis: Secondary | ICD-10-CM | POA: Diagnosis not present

## 2018-11-03 DIAGNOSIS — I69354 Hemiplegia and hemiparesis following cerebral infarction affecting left non-dominant side: Secondary | ICD-10-CM | POA: Diagnosis not present

## 2018-11-03 DIAGNOSIS — I351 Nonrheumatic aortic (valve) insufficiency: Secondary | ICD-10-CM | POA: Diagnosis not present

## 2018-11-03 DIAGNOSIS — R1312 Dysphagia, oropharyngeal phase: Secondary | ICD-10-CM | POA: Diagnosis not present

## 2018-11-03 DIAGNOSIS — E46 Unspecified protein-calorie malnutrition: Secondary | ICD-10-CM | POA: Diagnosis not present

## 2018-11-03 DIAGNOSIS — I071 Rheumatic tricuspid insufficiency: Secondary | ICD-10-CM | POA: Diagnosis not present

## 2018-11-03 DIAGNOSIS — R296 Repeated falls: Secondary | ICD-10-CM | POA: Diagnosis not present

## 2018-11-03 DIAGNOSIS — S62521D Displaced fracture of distal phalanx of right thumb, subsequent encounter for fracture with routine healing: Secondary | ICD-10-CM | POA: Diagnosis not present

## 2018-11-03 DIAGNOSIS — D649 Anemia, unspecified: Secondary | ICD-10-CM | POA: Diagnosis not present

## 2018-11-03 DIAGNOSIS — I10 Essential (primary) hypertension: Secondary | ICD-10-CM | POA: Diagnosis not present

## 2018-11-03 DIAGNOSIS — M1711 Unilateral primary osteoarthritis, right knee: Secondary | ICD-10-CM | POA: Diagnosis not present

## 2018-11-03 DIAGNOSIS — C7951 Secondary malignant neoplasm of bone: Secondary | ICD-10-CM | POA: Diagnosis not present

## 2018-11-04 DIAGNOSIS — I6389 Other cerebral infarction: Secondary | ICD-10-CM | POA: Diagnosis not present

## 2018-11-05 DIAGNOSIS — S62521D Displaced fracture of distal phalanx of right thumb, subsequent encounter for fracture with routine healing: Secondary | ICD-10-CM | POA: Diagnosis not present

## 2018-11-05 DIAGNOSIS — E786 Lipoprotein deficiency: Secondary | ICD-10-CM | POA: Diagnosis not present

## 2018-11-05 DIAGNOSIS — E46 Unspecified protein-calorie malnutrition: Secondary | ICD-10-CM | POA: Diagnosis not present

## 2018-11-05 DIAGNOSIS — H9193 Unspecified hearing loss, bilateral: Secondary | ICD-10-CM | POA: Diagnosis not present

## 2018-11-05 DIAGNOSIS — Z79899 Other long term (current) drug therapy: Secondary | ICD-10-CM | POA: Diagnosis not present

## 2018-11-05 DIAGNOSIS — I071 Rheumatic tricuspid insufficiency: Secondary | ICD-10-CM | POA: Diagnosis not present

## 2018-11-05 DIAGNOSIS — R1312 Dysphagia, oropharyngeal phase: Secondary | ICD-10-CM | POA: Diagnosis not present

## 2018-11-05 DIAGNOSIS — D649 Anemia, unspecified: Secondary | ICD-10-CM | POA: Diagnosis not present

## 2018-11-05 DIAGNOSIS — C7951 Secondary malignant neoplasm of bone: Secondary | ICD-10-CM | POA: Diagnosis not present

## 2018-11-05 DIAGNOSIS — E785 Hyperlipidemia, unspecified: Secondary | ICD-10-CM | POA: Diagnosis not present

## 2018-11-05 DIAGNOSIS — I340 Nonrheumatic mitral (valve) insufficiency: Secondary | ICD-10-CM | POA: Diagnosis not present

## 2018-11-05 DIAGNOSIS — M1711 Unilateral primary osteoarthritis, right knee: Secondary | ICD-10-CM | POA: Diagnosis not present

## 2018-11-05 DIAGNOSIS — I351 Nonrheumatic aortic (valve) insufficiency: Secondary | ICD-10-CM | POA: Diagnosis not present

## 2018-11-05 DIAGNOSIS — M6281 Muscle weakness (generalized): Secondary | ICD-10-CM | POA: Diagnosis not present

## 2018-11-05 DIAGNOSIS — R278 Other lack of coordination: Secondary | ICD-10-CM | POA: Diagnosis not present

## 2018-11-05 DIAGNOSIS — K219 Gastro-esophageal reflux disease without esophagitis: Secondary | ICD-10-CM | POA: Diagnosis not present

## 2018-11-05 DIAGNOSIS — I69354 Hemiplegia and hemiparesis following cerebral infarction affecting left non-dominant side: Secondary | ICD-10-CM | POA: Diagnosis not present

## 2018-11-05 DIAGNOSIS — M6282 Rhabdomyolysis: Secondary | ICD-10-CM | POA: Diagnosis not present

## 2018-11-05 DIAGNOSIS — R296 Repeated falls: Secondary | ICD-10-CM | POA: Diagnosis not present

## 2018-11-05 DIAGNOSIS — H5461 Unqualified visual loss, right eye, normal vision left eye: Secondary | ICD-10-CM | POA: Diagnosis not present

## 2018-11-05 DIAGNOSIS — I10 Essential (primary) hypertension: Secondary | ICD-10-CM | POA: Diagnosis not present

## 2018-11-05 DIAGNOSIS — W109XXD Fall (on) (from) unspecified stairs and steps, subsequent encounter: Secondary | ICD-10-CM | POA: Diagnosis not present

## 2018-11-06 DIAGNOSIS — I69354 Hemiplegia and hemiparesis following cerebral infarction affecting left non-dominant side: Secondary | ICD-10-CM | POA: Diagnosis not present

## 2018-11-06 DIAGNOSIS — E46 Unspecified protein-calorie malnutrition: Secondary | ICD-10-CM | POA: Diagnosis not present

## 2018-11-06 DIAGNOSIS — R296 Repeated falls: Secondary | ICD-10-CM | POA: Diagnosis not present

## 2018-11-06 DIAGNOSIS — H5461 Unqualified visual loss, right eye, normal vision left eye: Secondary | ICD-10-CM | POA: Diagnosis not present

## 2018-11-06 DIAGNOSIS — M6282 Rhabdomyolysis: Secondary | ICD-10-CM | POA: Diagnosis not present

## 2018-11-06 DIAGNOSIS — R278 Other lack of coordination: Secondary | ICD-10-CM | POA: Diagnosis not present

## 2018-11-06 DIAGNOSIS — I340 Nonrheumatic mitral (valve) insufficiency: Secondary | ICD-10-CM | POA: Diagnosis not present

## 2018-11-06 DIAGNOSIS — I351 Nonrheumatic aortic (valve) insufficiency: Secondary | ICD-10-CM | POA: Diagnosis not present

## 2018-11-06 DIAGNOSIS — I071 Rheumatic tricuspid insufficiency: Secondary | ICD-10-CM | POA: Diagnosis not present

## 2018-11-06 DIAGNOSIS — E785 Hyperlipidemia, unspecified: Secondary | ICD-10-CM | POA: Diagnosis not present

## 2018-11-06 DIAGNOSIS — H9193 Unspecified hearing loss, bilateral: Secondary | ICD-10-CM | POA: Diagnosis not present

## 2018-11-06 DIAGNOSIS — K219 Gastro-esophageal reflux disease without esophagitis: Secondary | ICD-10-CM | POA: Diagnosis not present

## 2018-11-06 DIAGNOSIS — W109XXD Fall (on) (from) unspecified stairs and steps, subsequent encounter: Secondary | ICD-10-CM | POA: Diagnosis not present

## 2018-11-06 DIAGNOSIS — S62521D Displaced fracture of distal phalanx of right thumb, subsequent encounter for fracture with routine healing: Secondary | ICD-10-CM | POA: Diagnosis not present

## 2018-11-06 DIAGNOSIS — M6281 Muscle weakness (generalized): Secondary | ICD-10-CM | POA: Diagnosis not present

## 2018-11-06 DIAGNOSIS — Z79899 Other long term (current) drug therapy: Secondary | ICD-10-CM | POA: Diagnosis not present

## 2018-11-06 DIAGNOSIS — I10 Essential (primary) hypertension: Secondary | ICD-10-CM | POA: Diagnosis not present

## 2018-11-06 DIAGNOSIS — D649 Anemia, unspecified: Secondary | ICD-10-CM | POA: Diagnosis not present

## 2018-11-06 DIAGNOSIS — R1312 Dysphagia, oropharyngeal phase: Secondary | ICD-10-CM | POA: Diagnosis not present

## 2018-11-06 DIAGNOSIS — E786 Lipoprotein deficiency: Secondary | ICD-10-CM | POA: Diagnosis not present

## 2018-11-06 DIAGNOSIS — M1711 Unilateral primary osteoarthritis, right knee: Secondary | ICD-10-CM | POA: Diagnosis not present

## 2018-11-06 DIAGNOSIS — C7951 Secondary malignant neoplasm of bone: Secondary | ICD-10-CM | POA: Diagnosis not present

## 2018-11-12 DIAGNOSIS — H9193 Unspecified hearing loss, bilateral: Secondary | ICD-10-CM | POA: Diagnosis not present

## 2018-11-12 DIAGNOSIS — K219 Gastro-esophageal reflux disease without esophagitis: Secondary | ICD-10-CM | POA: Diagnosis not present

## 2018-11-12 DIAGNOSIS — R4020 Unspecified coma: Secondary | ICD-10-CM | POA: Diagnosis not present

## 2018-11-12 DIAGNOSIS — I071 Rheumatic tricuspid insufficiency: Secondary | ICD-10-CM | POA: Diagnosis not present

## 2018-11-12 DIAGNOSIS — Z79899 Other long term (current) drug therapy: Secondary | ICD-10-CM | POA: Diagnosis not present

## 2018-11-12 DIAGNOSIS — I469 Cardiac arrest, cause unspecified: Secondary | ICD-10-CM | POA: Diagnosis not present

## 2018-11-12 DIAGNOSIS — W109XXD Fall (on) (from) unspecified stairs and steps, subsequent encounter: Secondary | ICD-10-CM | POA: Diagnosis not present

## 2018-11-12 DIAGNOSIS — R296 Repeated falls: Secondary | ICD-10-CM | POA: Diagnosis not present

## 2018-11-12 DIAGNOSIS — E785 Hyperlipidemia, unspecified: Secondary | ICD-10-CM | POA: Diagnosis not present

## 2018-11-12 DIAGNOSIS — C7951 Secondary malignant neoplasm of bone: Secondary | ICD-10-CM | POA: Diagnosis not present

## 2018-11-12 DIAGNOSIS — I10 Essential (primary) hypertension: Secondary | ICD-10-CM | POA: Diagnosis not present

## 2018-11-12 DIAGNOSIS — E786 Lipoprotein deficiency: Secondary | ICD-10-CM | POA: Diagnosis not present

## 2018-11-12 DIAGNOSIS — I340 Nonrheumatic mitral (valve) insufficiency: Secondary | ICD-10-CM | POA: Diagnosis not present

## 2018-11-12 DIAGNOSIS — E46 Unspecified protein-calorie malnutrition: Secondary | ICD-10-CM | POA: Diagnosis not present

## 2018-11-12 DIAGNOSIS — S62521D Displaced fracture of distal phalanx of right thumb, subsequent encounter for fracture with routine healing: Secondary | ICD-10-CM | POA: Diagnosis not present

## 2018-11-12 DIAGNOSIS — R278 Other lack of coordination: Secondary | ICD-10-CM | POA: Diagnosis not present

## 2018-11-12 DIAGNOSIS — R1312 Dysphagia, oropharyngeal phase: Secondary | ICD-10-CM | POA: Diagnosis not present

## 2018-11-12 DIAGNOSIS — H5461 Unqualified visual loss, right eye, normal vision left eye: Secondary | ICD-10-CM | POA: Diagnosis not present

## 2018-11-12 DIAGNOSIS — I351 Nonrheumatic aortic (valve) insufficiency: Secondary | ICD-10-CM | POA: Diagnosis not present

## 2018-11-12 DIAGNOSIS — M6281 Muscle weakness (generalized): Secondary | ICD-10-CM | POA: Diagnosis not present

## 2018-11-12 DIAGNOSIS — I69354 Hemiplegia and hemiparesis following cerebral infarction affecting left non-dominant side: Secondary | ICD-10-CM | POA: Diagnosis not present

## 2018-11-12 DIAGNOSIS — M6282 Rhabdomyolysis: Secondary | ICD-10-CM | POA: Diagnosis not present

## 2018-11-12 DIAGNOSIS — M1711 Unilateral primary osteoarthritis, right knee: Secondary | ICD-10-CM | POA: Diagnosis not present

## 2018-11-12 DIAGNOSIS — D649 Anemia, unspecified: Secondary | ICD-10-CM | POA: Diagnosis not present

## 2018-11-14 DIAGNOSIS — 419620001 Death: Secondary | SNOMED CT | POA: Diagnosis not present

## 2018-11-14 DEATH — deceased

## 2019-07-12 IMAGING — CR DG HIP (WITH OR WITHOUT PELVIS) 2-3V*R*
3 series · 3 of 3 positions shown · non-contrast
Comparison: None.

CLINICAL DATA: Fall with hip pain

EXAM:
DG HIP (WITH OR WITHOUT PELVIS) 2-3V RIGHT

[t pelvis ap]
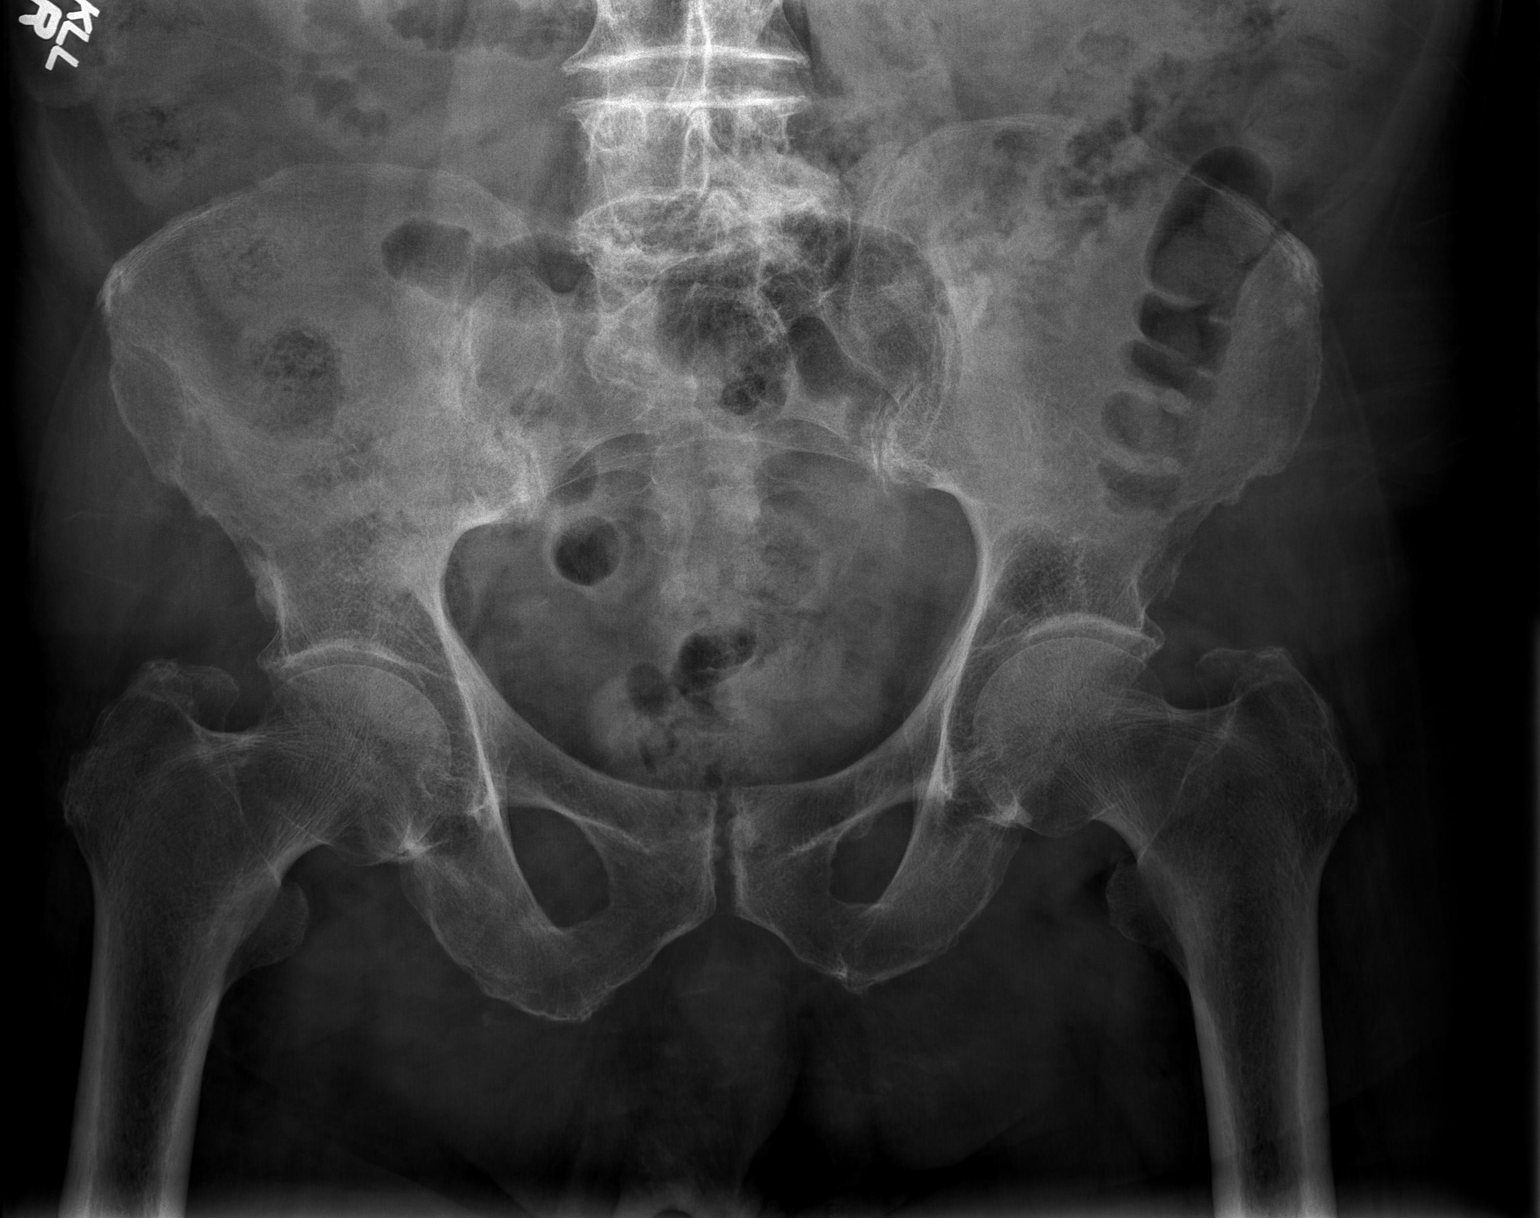

[t hip ap right]
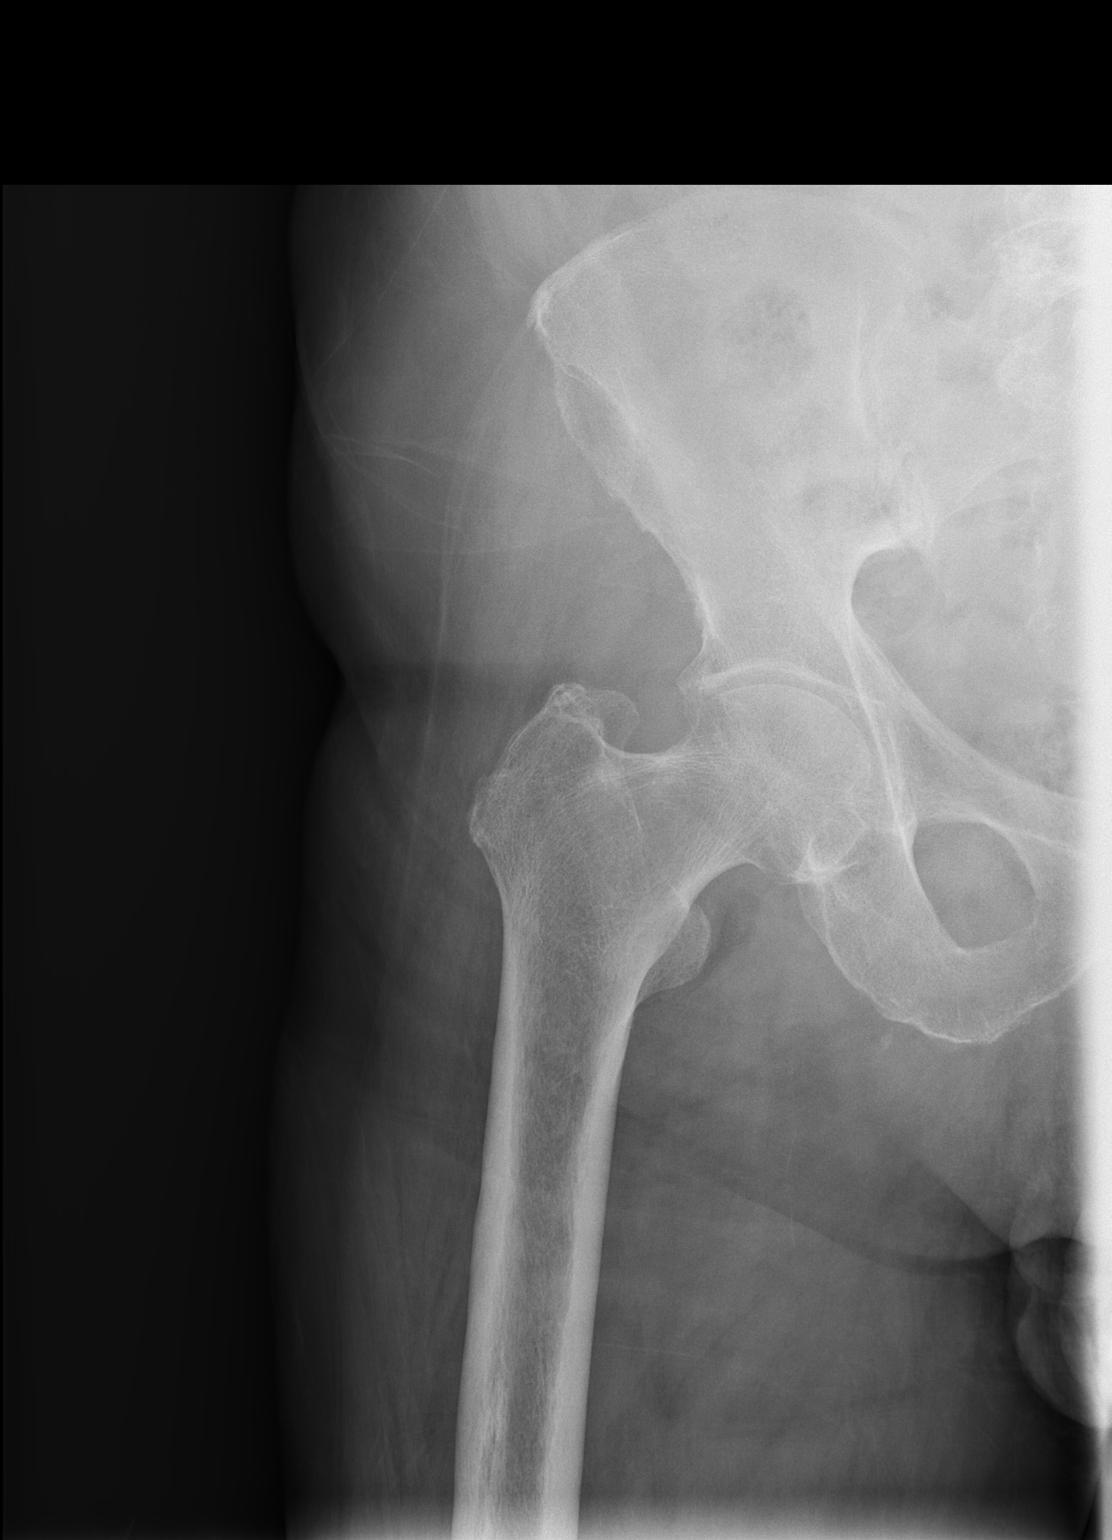

[t hip frog leg right]
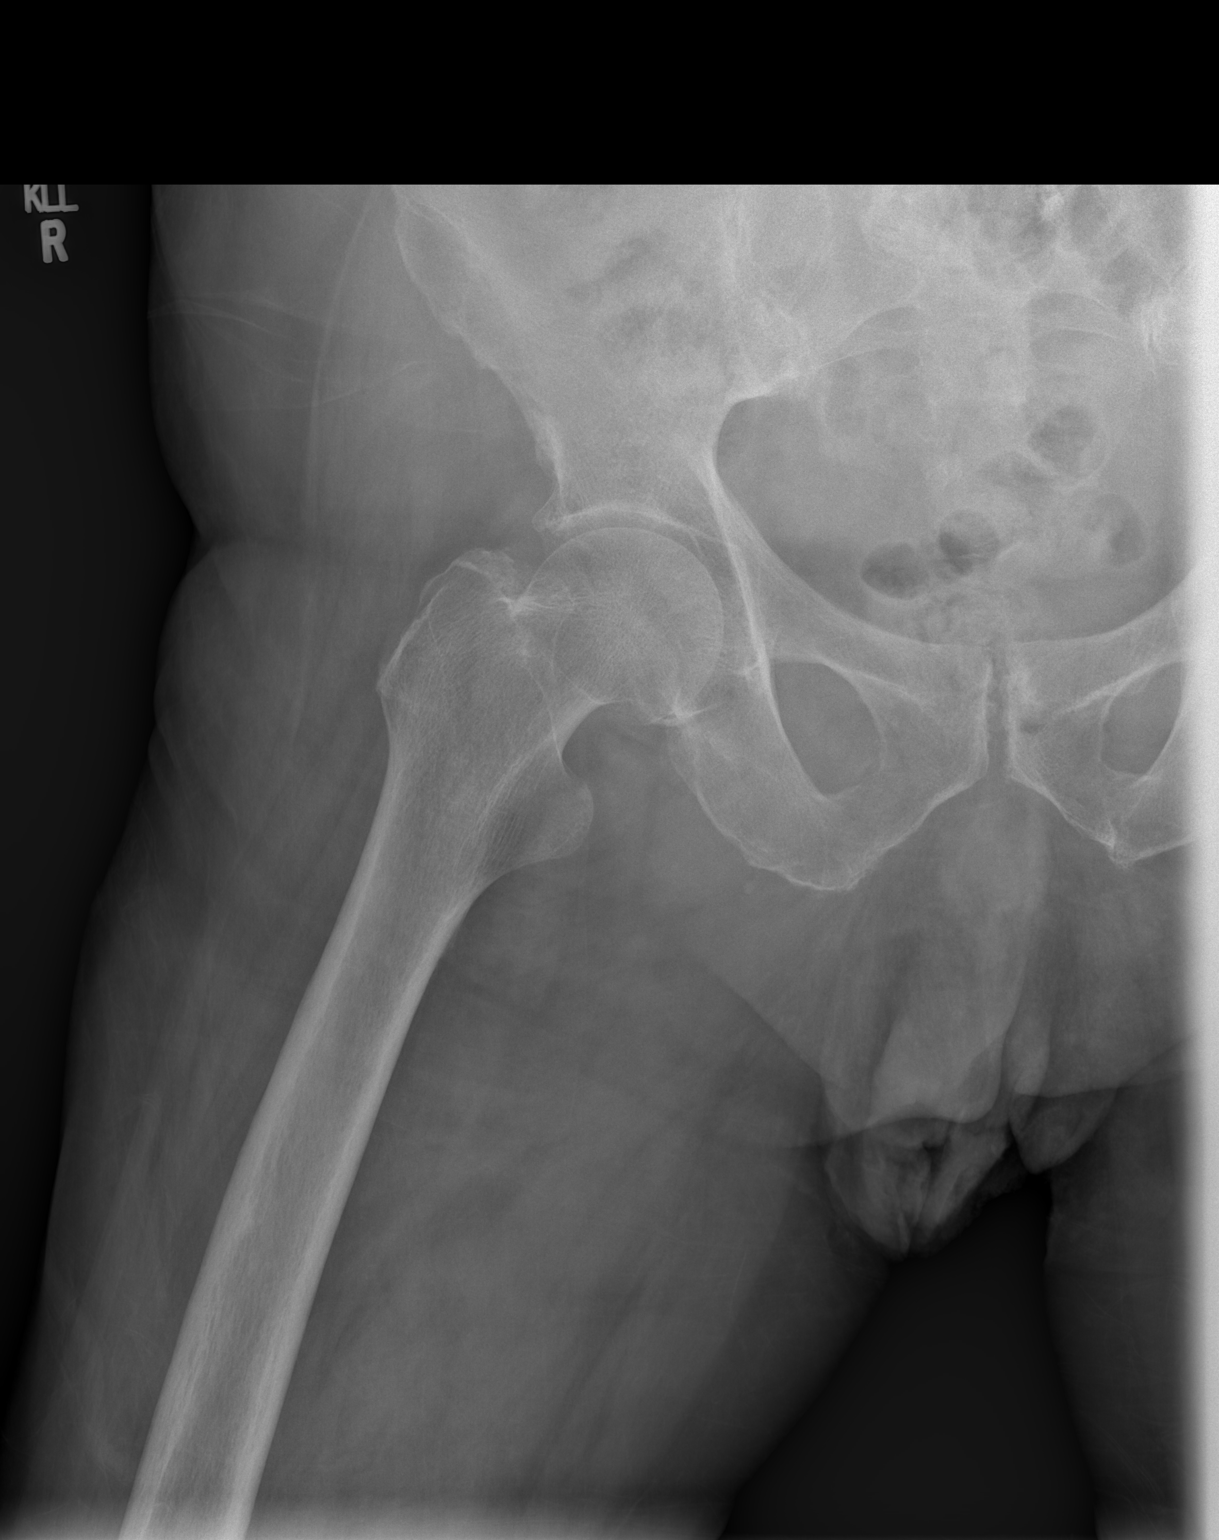

[3 of 3 positions shown; findings below may reference images not displayed]

FINDINGS: SI joint arthritis. Pubic symphysis is intact. Pubic rami are within
normal limits. Both femoral heads project in joint. No acute
displaced fracture or malalignment is seen.
IMPRESSION: Note acute osseous abnormality.

## 2019-07-12 IMAGING — CT CT CERVICAL SPINE W/O CM
4 of 8 series · 14 of 33 positions shown, 15 images · non-contrast
Comparison: None.

CLINICAL DATA: Trip and fall yesterday.

EXAM:
CT HEAD WITHOUT CONTRAST
CT CERVICAL SPINE WITHOUT CONTRAST
TECHNIQUE: Multidetector CT imaging of the head and cervical spine was
performed following the standard protocol without intravenous
contrast. Multiplanar CT image reconstructions of the cervical spine
were also generated.

[Series 6: coronal · coronal · 0.30mm/px · 3 of 74 slices shown]
[im 19/74  bone]
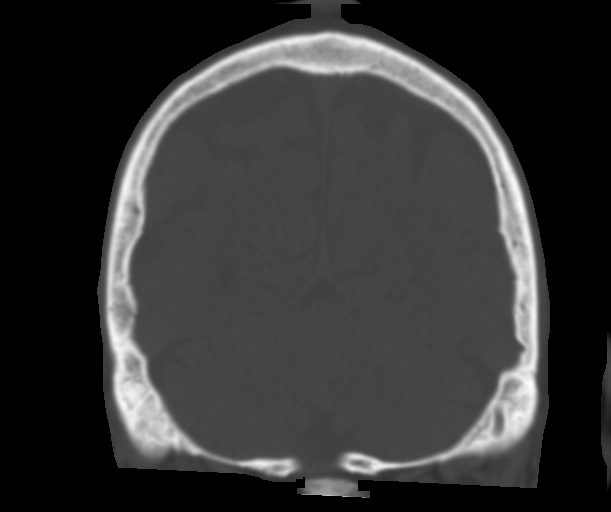
[im 37/74  bone]
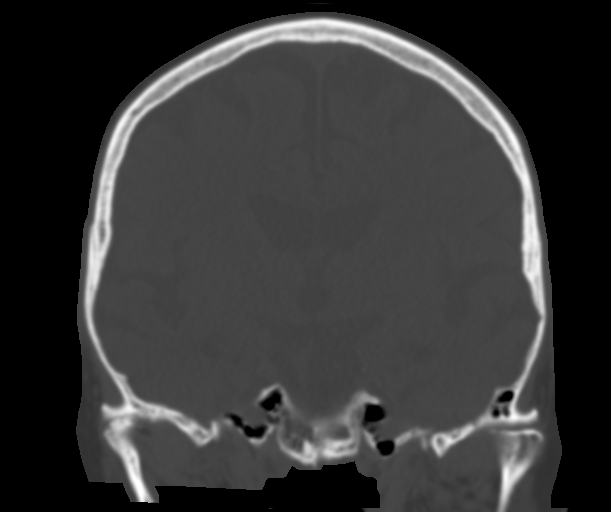
[im 55/74  bone]
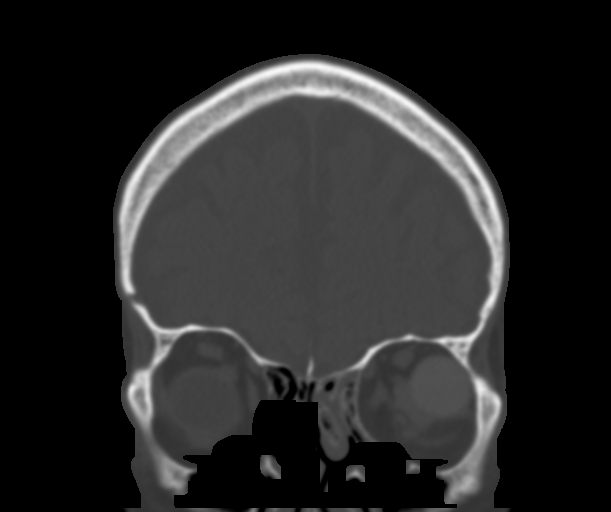

[Series 7: sagittal · sagittal · 0.30mm/px · 5 of 62 slices shown]
[im 11/62  bone]
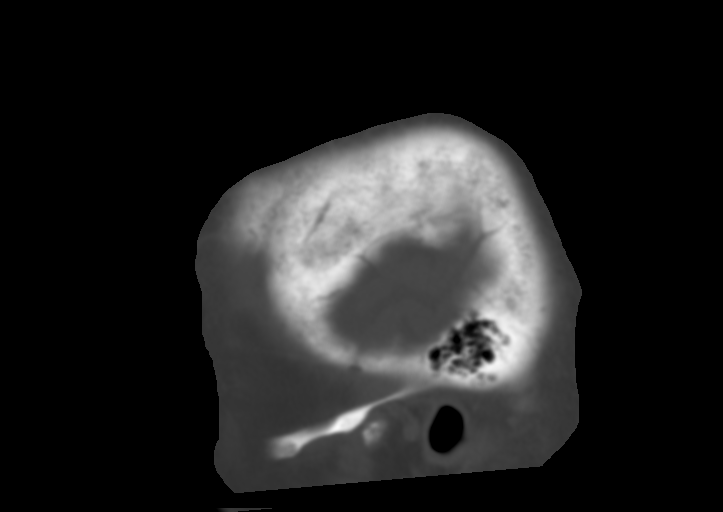
[im 21/62  bone]
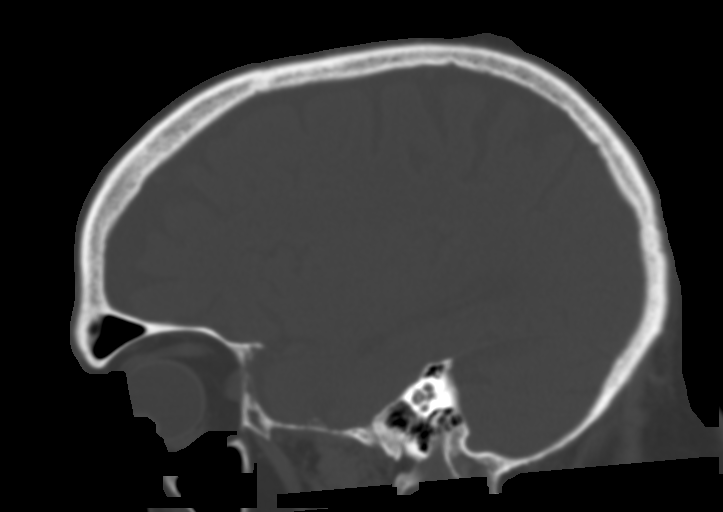
[im 31/62  bone]
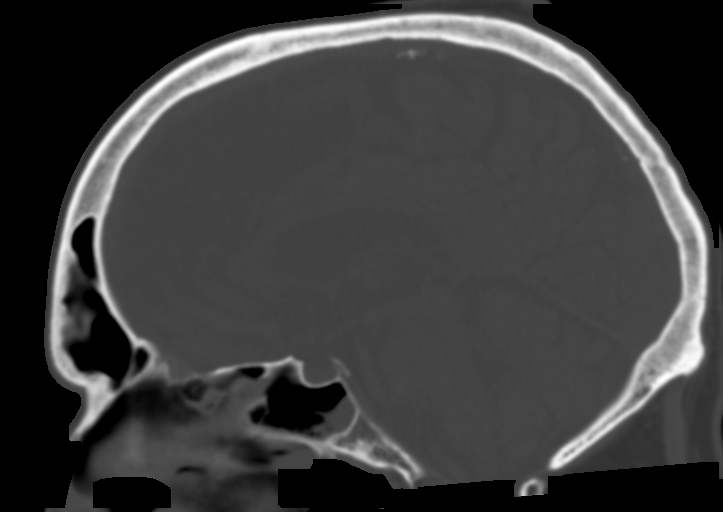
[im 41/62  bone]
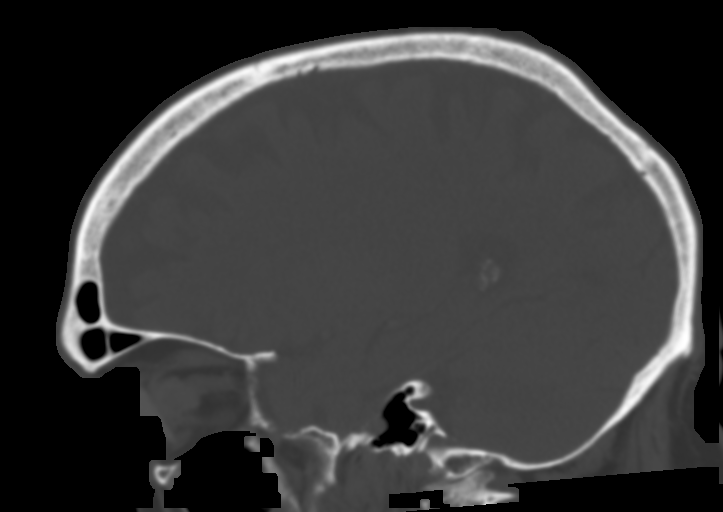
[im 51/62  bone]
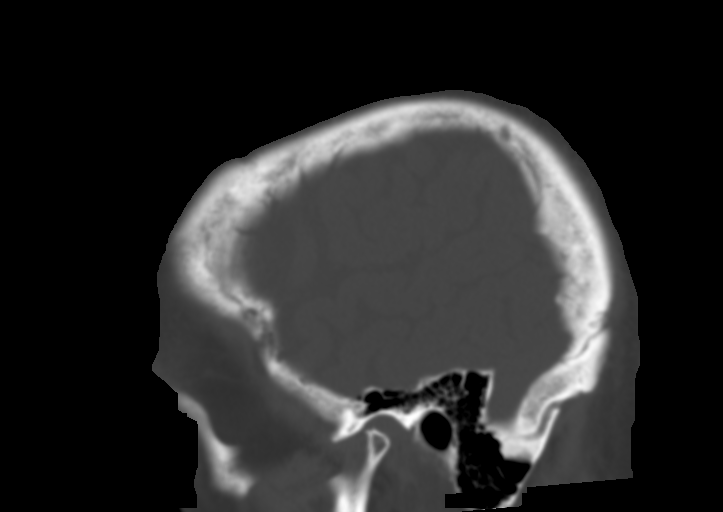

[Series 8: c-spine st · axial · 0.33mm/px · z∈[-278,-224]mm · 2 of 82 slices shown]
[im 28/82  bone]
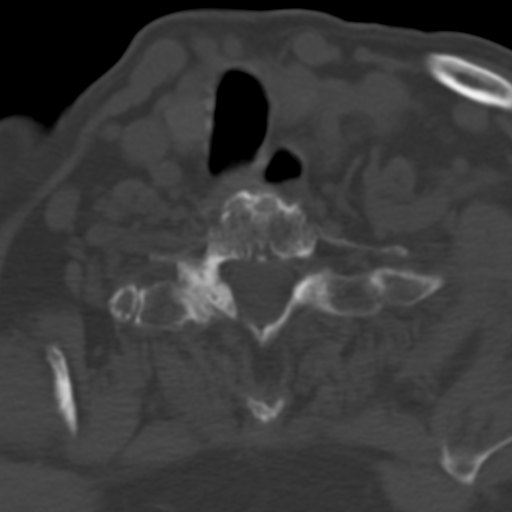
[im 55/82  bone]
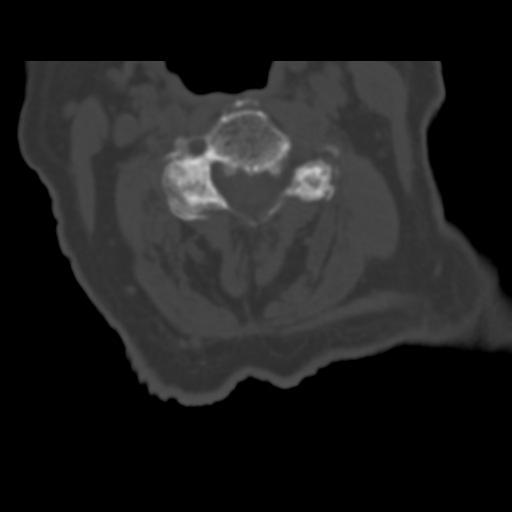

[Series 11: axial recon · axial · 0.23mm/px · z∈[-344,-214]mm · 4 of 120 slices shown, 5 images]
[im 24/120  soft-tissue]
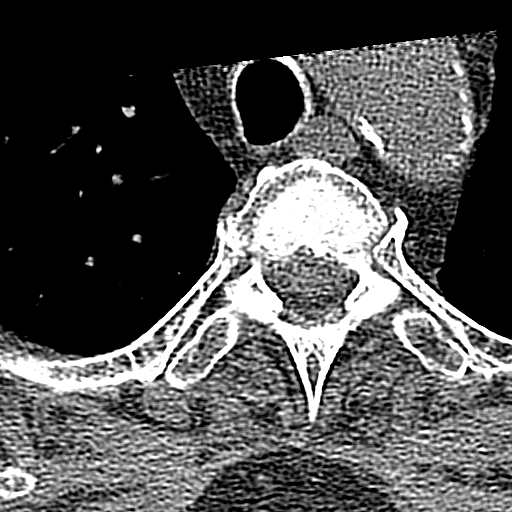
[im 24/120  bone]
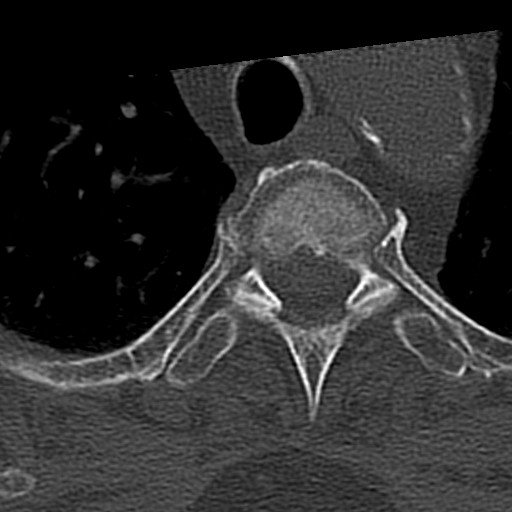
[im 48/120  bone]
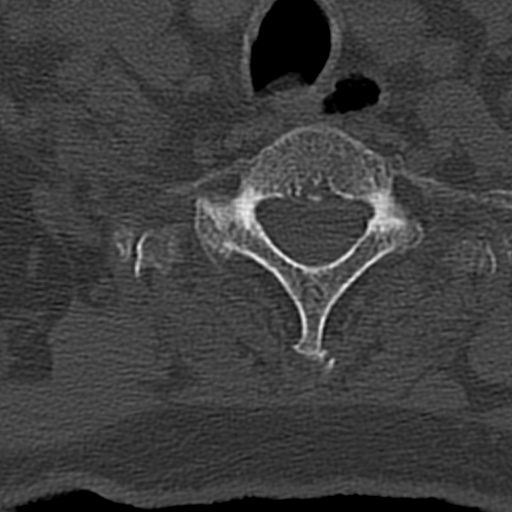
[im 72/120  bone]
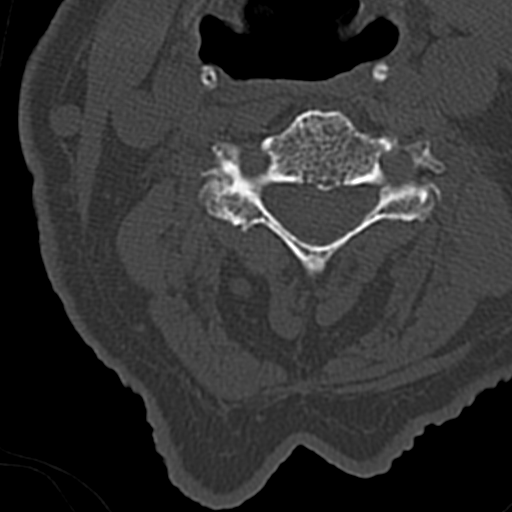
[im 96/120  bone]
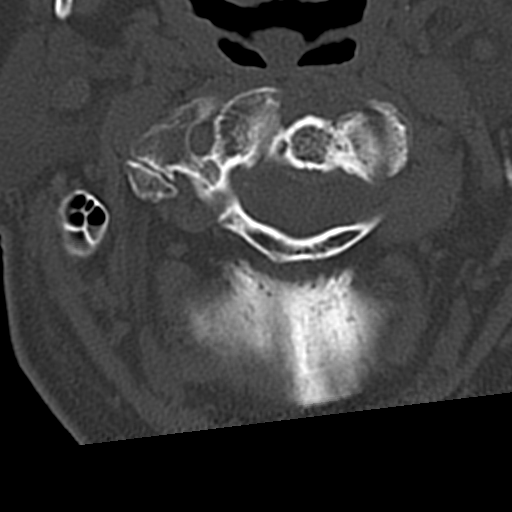

[14 of 33 positions shown; findings below may reference images not displayed]

FINDINGS: CT HEAD FINDINGS

Brain: No evidence of acute infarction, hemorrhage, hydrocephalus,
extra-axial collection or mass lesion/mass effect. Global cerebral
and cerebellar atrophy. Mild to moderate chronic small vessel
ischemia.

Vascular: Atherosclerosis of skullbase vasculature without
hyperdense vessel or abnormal calcification.

Skull: No skull fracture.  No focal lesion.

Sinuses/Orbits: Mucosal thickening in the left frontal sinus,
ethmoid air cells, left maxillary sinus and sphenoid sinuses. No
fluid levels. Mastoid air cells are clear. Left globe prosthesis.

Other: None.

CT CERVICAL SPINE FINDINGS

Alignment: Straightening of lordosis.  No traumatic subluxation.

Skull base and vertebrae: No acute fracture. Vertebral body heights
are maintained. The dens and skull base are intact.

Soft tissues and spinal canal: No prevertebral fluid or swelling. No
visible canal hematoma.

Disc levels: Diffuse disc space narrowing and endplate spurring,
most prominent at C4-C5. Degenerative change at C1-C2. Multilevel
facet arthropathy.

Upper chest: No acute abnormality.

Other: Carotid and aortic arch atherosclerosis.
IMPRESSION: 1. No acute intracranial abnormality. No skull fracture. Atrophy and
chronic small vessel ischemia.
2. Multilevel degenerative change throughout the cervical spine
without acute fracture or subluxation.
3. Incidental findings of paranasal sinus inflammation and carotid
vascular calcifications.

## 2019-07-12 IMAGING — CR DG CHEST 2V
2 series · 2 of 2 positions shown · non-contrast
Comparison: 05/01/2007

CLINICAL DATA: Fell 2 days ago, altered mental status

EXAM:
CHEST  2 VIEW

[w chest lat]
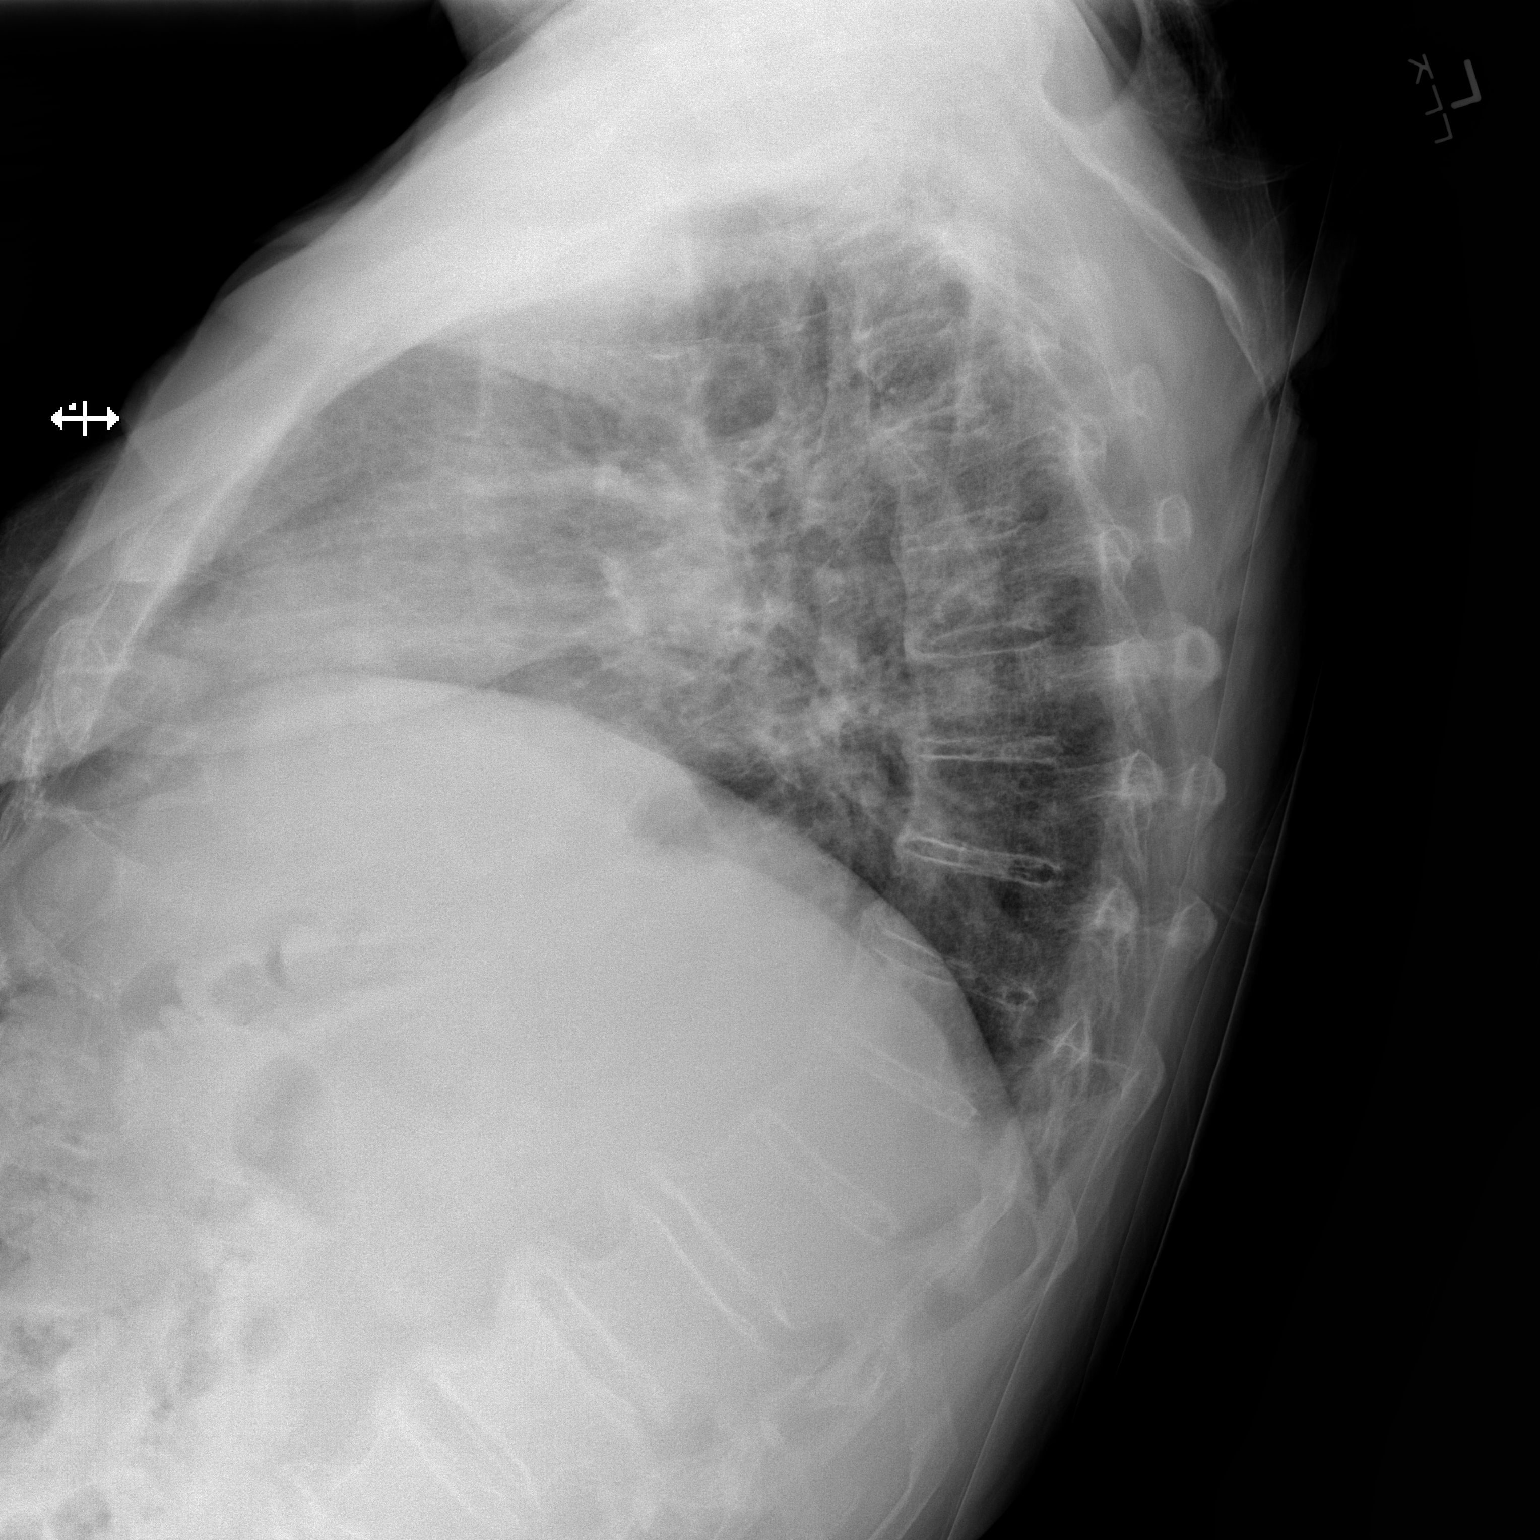

[x chest ap]
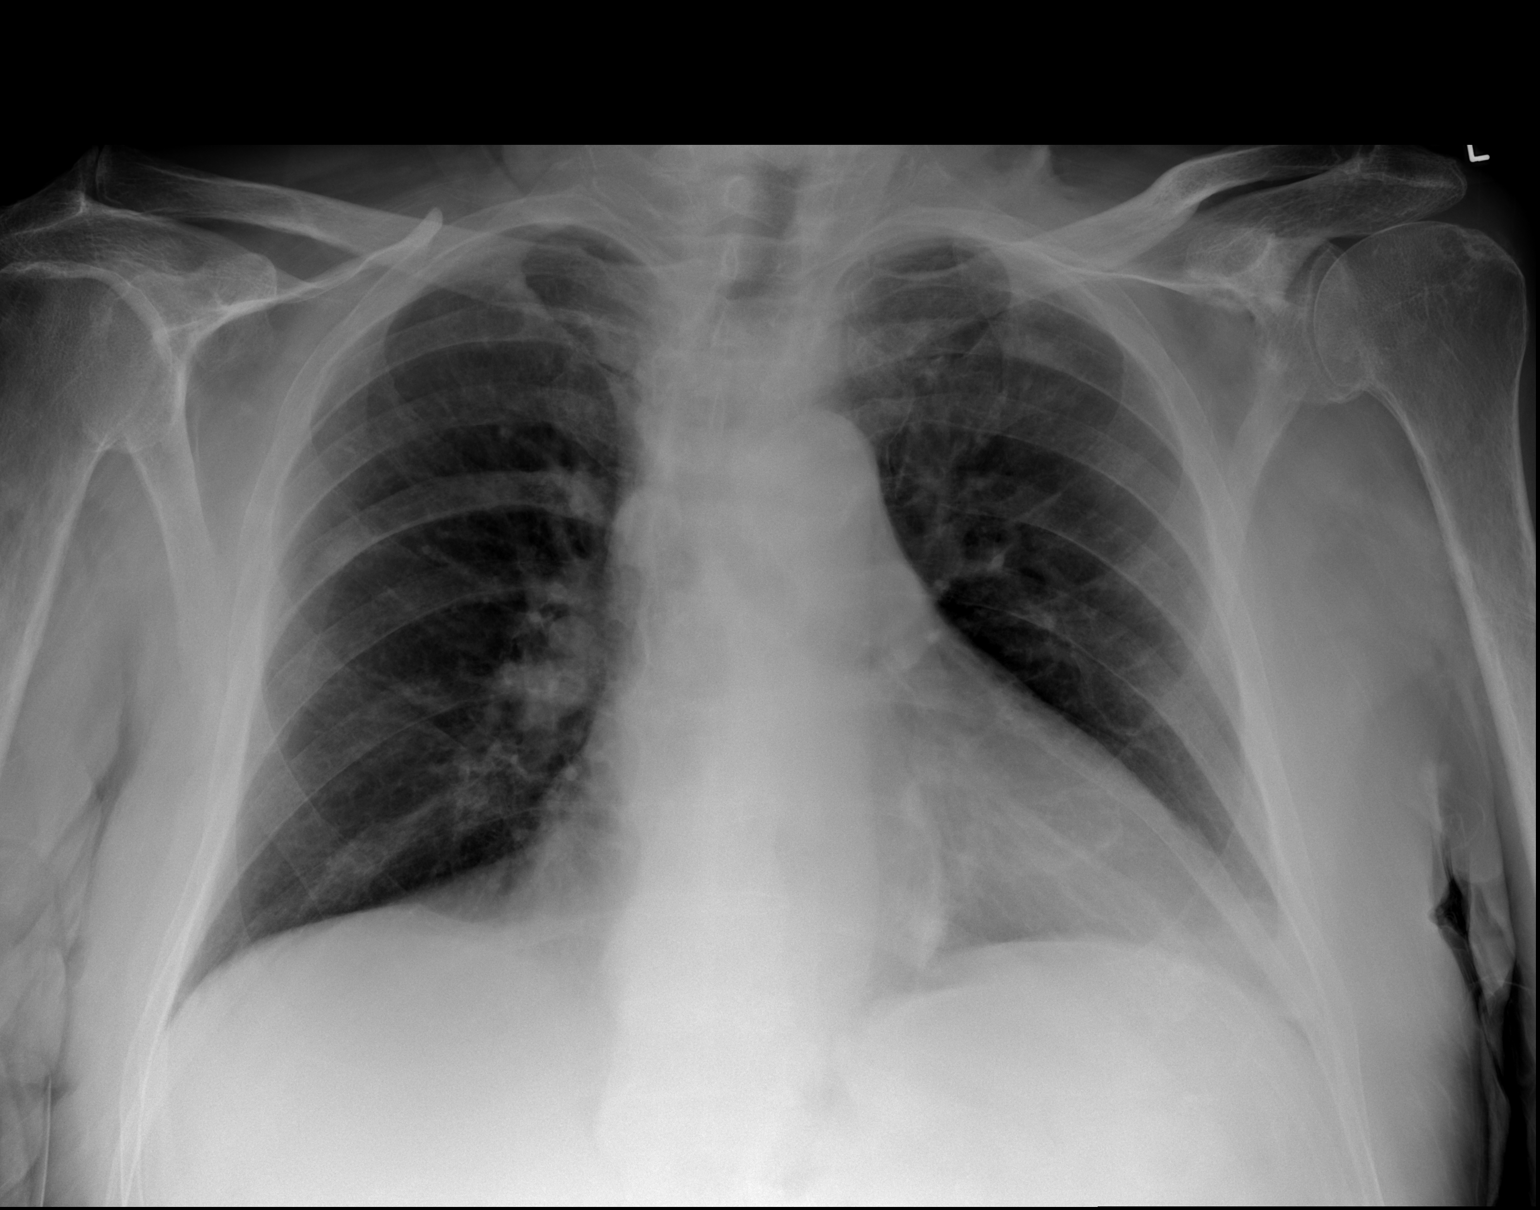

[2 of 2 positions shown; findings below may reference images not displayed]

FINDINGS: Mildly low lung volumes. Linear scar or atelectasis at the medial
left lung base. Borderline to mild cardiomegaly. Aortic
atherosclerosis. No pneumothorax. Degenerative changes at both
shoulders and within the spine.
IMPRESSION: No active cardiopulmonary disease. Borderline to mild cardiomegaly.
Scarring or atelectasis at the medial left lung base
